# Patient Record
Sex: Male | Born: 1960 | Race: White | Hispanic: No | Marital: Married | Smoking: Current every day smoker
Health system: Southern US, Community
[De-identification: ages and names within clinical notes are randomized; demographics above are authoritative.]

## PROBLEM LIST (undated history)

## (undated) DIAGNOSIS — F419 Anxiety disorder, unspecified: Secondary | ICD-10-CM

## (undated) DIAGNOSIS — M069 Rheumatoid arthritis, unspecified: Secondary | ICD-10-CM

## (undated) DIAGNOSIS — F17200 Nicotine dependence, unspecified, uncomplicated: Secondary | ICD-10-CM

## (undated) DIAGNOSIS — E785 Hyperlipidemia, unspecified: Secondary | ICD-10-CM

## (undated) DIAGNOSIS — I10 Essential (primary) hypertension: Secondary | ICD-10-CM

## (undated) HISTORY — PX: COLONOSCOPY: SHX174

---

## 2019-04-21 ENCOUNTER — Encounter: Payer: Self-pay | Admitting: Emergency Medicine

## 2019-04-21 ENCOUNTER — Emergency Department
Admission: EM | Admit: 2019-04-21 | Discharge: 2019-04-21 | Disposition: A | Discharge status: Home / Self Care | Payer: 59 | Attending: Emergency Medicine | Admitting: Emergency Medicine

## 2019-04-21 ENCOUNTER — Emergency Department: Payer: 59

## 2019-04-21 ENCOUNTER — Other Ambulatory Visit: Payer: Self-pay

## 2019-04-21 DIAGNOSIS — F17210 Nicotine dependence, cigarettes, uncomplicated: Secondary | ICD-10-CM | POA: Diagnosis not present

## 2019-04-21 DIAGNOSIS — R079 Chest pain, unspecified: Secondary | ICD-10-CM | POA: Diagnosis not present

## 2019-04-21 LAB — BASIC METABOLIC PANEL
Anion gap: 6 (ref 5–15)
BUN: 16 mg/dL (ref 6–20)
CO2: 26 mmol/L (ref 22–32)
Calcium: 8.8 mg/dL — ABNORMAL LOW (ref 8.9–10.3)
Chloride: 108 mmol/L (ref 98–111)
Creatinine, Ser: 1.1 mg/dL (ref 0.61–1.24)
GFR calc Af Amer: >60 mL/min (ref >60)
GFR calc non Af Amer: >60 mL/min (ref >60)
Glucose, Bld: 87 mg/dL (ref 70–99)
Potassium: 4.2 mmol/L (ref 3.5–5.1)
Sodium: 140 mmol/L (ref 135–145)

## 2019-04-21 LAB — CBC
HCT: 39.3 % (ref 39.0–52.0)
Hemoglobin: 13.6 g/dL (ref 13.0–17.0)
MCH: 33 pg (ref 26.0–34.0)
MCHC: 34.6 g/dL (ref 30.0–36.0)
MCV: 95.4 fL (ref 80.0–100.0)
Platelets: 217 10*3/uL (ref 150–400)
RBC: 4.12 MIL/uL — ABNORMAL LOW (ref 4.22–5.81)
RDW: 12.8 % (ref 11.5–15.5)
WBC: 8.2 10*3/uL (ref 4.0–10.5)
nRBC: 0 % (ref 0.0–0.2)

## 2019-04-21 LAB — TROPONIN I: Troponin I: <0.03 ng/mL (ref <0.03)

## 2019-04-21 MED ORDER — SODIUM CHLORIDE 0.9% FLUSH: 3.0000 mL | Freq: Once | INTRAVENOUS | Status: DC

## 2019-04-21 MED ORDER — LORAZEPAM 1 MG PO TABS: 1.0000 mg | ORAL_TABLET | Freq: Once | ORAL | 0 refills | Status: DC | PRN

## 2019-04-21 NOTE — ED Notes (Signed)
Pt resting in bed comfortably. Denies any needs.

## 2019-04-21 NOTE — ED Notes (Signed)
Pt denies being on any BP meds.

## 2019-04-21 NOTE — ED Provider Notes (Signed)
[facility] Emergency Department Provider Note       Time seen: ----------------------------------------- 3:16 PM on 04/21/2019 -----------------------------------------   I have reviewed the triage vital signs and the nursing notes.  HISTORY   Chief Complaint Chest Pain   HPI Edwin Ali is a 58 y.o. male with no significant past medical history who presents to the ED for midsternal chest pain that began 3 days ago.  Pain is been intermittent, he has not had sweats, nausea or shortness of breath.  He describes it as a burning sensation, currently appears to have resolved.  It lasted for about 4 hours today.  History reviewed. No pertinent past medical history.  There are no active problems to display for this patient.   History reviewed. No pertinent surgical history.  Allergies Patient has no known allergies.  Social History Social History   Tobacco Use  . Smoking status: Current Every Day Smoker    Packs/day: 1.00    Types: Cigarettes  . Smokeless tobacco: Never Used  Substance Use Topics  . Alcohol use: Yes    Comment: occas.   . Drug use: Not on file   Review of Systems Constitutional: Negative for fever. Cardiovascular: Positive for chest pain Respiratory: Negative for shortness of breath. Gastrointestinal: Negative for abdominal pain, vomiting and diarrhea. Musculoskeletal: Negative for back pain. Skin: Negative for rash. Neurological: Negative for headaches, focal weakness or numbness.  All systems negative/normal/unremarkable except as stated in the HPI  ____________________________________________   PHYSICAL EXAM:  VITAL SIGNS: ED Triage Vitals  Enc Vitals Group     BP 04/21/19 1456 (!) 182/79     Pulse Rate 04/21/19 1456 62     Resp 04/21/19 1456 18     Temp 04/21/19 1456 98.4 F (36.9 C)     Temp Source 04/21/19 1456 Oral     SpO2 04/21/19 1456 97 %     Weight 04/21/19 1459 217 lb (98.4 kg)     Height 04/21/19  1459 5\' 9"  (1.753 m)     Head Circumference --      Peak Flow --      Pain Score 04/21/19 1458 2     Pain Loc --      Pain Edu? --      Excl. in GC? --     Constitutional: Alert and oriented. Well appearing and in no distress. Eyes: Conjunctivae are normal. Normal extraocular movements. ENT      Head: Normocephalic and atraumatic.      Nose: No congestion/rhinnorhea.      Mouth/Throat: Mucous membranes are moist.      Neck: No stridor. Cardiovascular: Normal rate, regular rhythm. No murmurs, rubs, or gallops. Respiratory: Normal respiratory effort without tachypnea nor retractions. Breath sounds are clear and equal bilaterally. No wheezes/rales/rhonchi. Gastrointestinal: Soft and nontender. Normal bowel sounds Musculoskeletal: Nontender with normal range of motion in extremities. No lower extremity tenderness nor edema. Neurologic:  Normal speech and language. No gross focal neurologic deficits are appreciated.  Skin:  Skin is warm, dry and intact. No rash noted. Psychiatric: Mood and affect are normal. Speech and behavior are normal.  ____________________________________________  EKG: Interpreted by me.  Sinus rhythm.  61 bpm, incomplete right bundle branch block, left axis deviation, normal QT  ____________________________________________  ED COURSE:  As part of my medical decision making, I reviewed the following data within the electronic MEDICAL RECORD NUMBER History obtained from family if available, nursing notes, old chart and ekg, as well as  notes from prior ED visits. Patient presented for chest pain, we will assess with labs and imaging as indicated at this time.   Procedures  Edwin Ali was evaluated in Emergency Department on 04/21/2019 for the symptoms described in the history of present illness. He was evaluated in the context of the global COVID-19 pandemic, which necessitated consideration that the patient might be at risk for infection with the SARS-CoV-2 virus that  causes COVID-19. Institutional protocols and algorithms that pertain to the evaluation of patients at risk for COVID-19 are in a state of rapid change based on information released by regulatory bodies including the CDC and federal and state organizations. These policies and algorithms were followed during the patient's care in the ED.  ____________________________________________   LABS (pertinent positives/negatives)  Labs Reviewed  BASIC METABOLIC PANEL - Abnormal; Notable for the following components:      Result Value   Calcium 8.8 (*)    All other components within normal limits  CBC - Abnormal; Notable for the following components:   RBC 4.12 (*)    All other components within normal limits  TROPONIN I    RADIOLOGY Images were viewed by me  Chest x-ray IMPRESSION: No edema or consolidation. ____________________________________________   DIFFERENTIAL DIAGNOSIS   Musculoskeletal pain, GERD, anxiety, unstable angina  FINAL ASSESSMENT AND PLAN  Chest pain   Plan: The patient had presented for chest pain. Patient's labs did not reveal any acute process. Patient's imaging is reassuring.  He is asymptomatic at this point.  He is cleared for outpatient follow-up.   Ulice Dash, MD    Note: This note was generated in part or whole with voice recognition software. Voice recognition is usually quite accurate but there are transcription errors that can and very often do occur. I apologize for any typographical errors that were not detected and corrected.     Emily Filbert, MD 04/21/19 [phone]

## 2019-04-21 NOTE — ED Notes (Signed)
Pt back to room.

## 2019-04-21 NOTE — ED Triage Notes (Signed)
Pt here with c/o midsternal cp that began 3 days ago, has been under a lot of stress at work and has had some stressful events at home as well. NAD. States goes to upper back as well.

## 2019-04-21 NOTE — ED Notes (Signed)
Pt leaving for imaging.

## 2020-01-25 ENCOUNTER — Ambulatory Visit
Admission: RE | Admit: 2020-01-25 | Discharge: 2020-01-25 | Disposition: A | Discharge status: Home / Self Care | Payer: 59 | Source: Ambulatory Visit | Attending: Internal Medicine | Admitting: Internal Medicine

## 2020-01-25 ENCOUNTER — Other Ambulatory Visit: Payer: Self-pay

## 2020-01-25 ENCOUNTER — Other Ambulatory Visit: Payer: Self-pay | Admitting: Internal Medicine

## 2020-01-25 DIAGNOSIS — M25531 Pain in right wrist: Secondary | ICD-10-CM

## 2020-01-25 DIAGNOSIS — M79641 Pain in right hand: Secondary | ICD-10-CM

## 2020-01-25 DIAGNOSIS — M25532 Pain in left wrist: Secondary | ICD-10-CM | POA: Diagnosis present

## 2020-01-25 DIAGNOSIS — M79642 Pain in left hand: Secondary | ICD-10-CM

## 2020-03-15 ENCOUNTER — Ambulatory Visit: Payer: 59 | Attending: Internal Medicine

## 2020-03-15 DIAGNOSIS — Z23 Encounter for immunization: Secondary | ICD-10-CM

## 2020-03-15 NOTE — Progress Notes (Signed)
   Covid-19 Vaccination Clinic  Name:  Edwin Ali    MRN: [id number] DOB: Jan 17, 1961  03/15/2020  Mr. Edwin Ali was observed post Covid-19 immunization for 15 minutes without incident. He was provided with Vaccine Information Sheet and instruction to access the V-Safe system.   Mr. Edwin Ali was instructed to call 911 with any severe reactions post vaccine: Marland Kitchen Difficulty breathing  . Swelling of face and throat  . A fast heartbeat  . A bad rash all over body  . Dizziness and weakness   Immunizations Administered    Name Date Dose VIS Date Route   Pfizer COVID-19 Vaccine 03/15/2020  9:25 AM 0.3 mL 11/18/2019 Intramuscular   Manufacturer: [employer], [employer]   Lot: [id number]   NDC: [address]-4

## 2020-04-10 ENCOUNTER — Ambulatory Visit: Payer: 59 | Attending: Internal Medicine

## 2020-04-10 DIAGNOSIS — Z23 Encounter for immunization: Secondary | ICD-10-CM

## 2020-04-10 NOTE — Progress Notes (Signed)
   Covid-19 Vaccination Clinic  Name:  Edwin Ali    MRN: [id number] DOB: 03-06-1961  04/10/2020  Edwin Ali was observed post Covid-19 immunization for 15 minutes without incident. He was provided with Vaccine Information Sheet and instruction to access the V-Safe system.   Edwin Ali was instructed to call 911 with any severe reactions post vaccine: Marland Kitchen Difficulty breathing  . Swelling of face and throat  . A fast heartbeat  . A bad rash all over body  . Dizziness and weakness   Immunizations Administered    Name Date Dose VIS Date Route   Pfizer COVID-19 Vaccine 04/10/2020  4:58 PM 0.3 mL 02/01/2019 Intramuscular   Manufacturer: [employer], [employer]   Lot: [id number]   NDC: [address]-6

## 2020-05-12 IMAGING — CR DG HAND COMPLETE 3+V*L*
1 series · 3 of 3 positions shown · non-contrast
Comparison: None.

CLINICAL DATA: Chronic pain in the bilateral hands and wrists for
several years, no known injury, worst pain the bilateral thumbs

EXAM:
RIGHT HAND - COMPLETE 3+ VIEW; LEFT WRIST - COMPLETE 3+ VIEW; RIGHT
WRIST - COMPLETE 3+ VIEW; LEFT HAND - COMPLETE 3+ VIEW

[Series 1: dg hand complete left · 0.14mm/px · 3 of 3 slices shown]
[im 1/3]
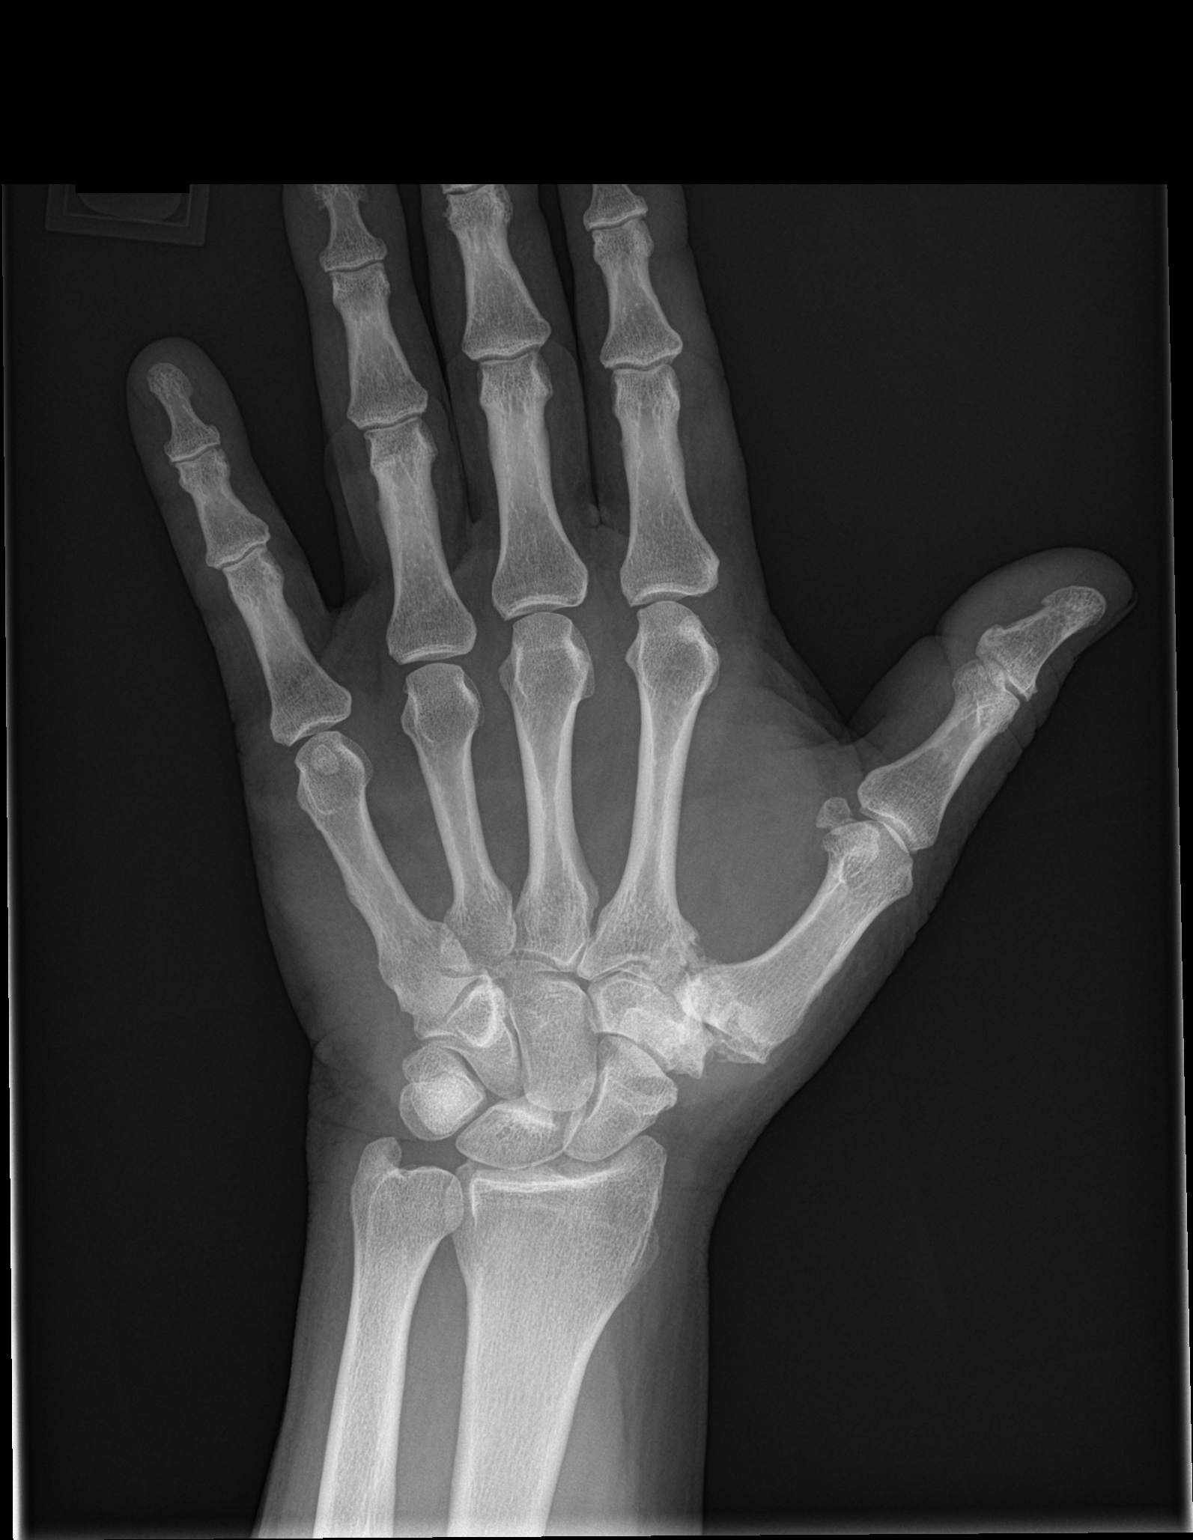
[im 2/3]
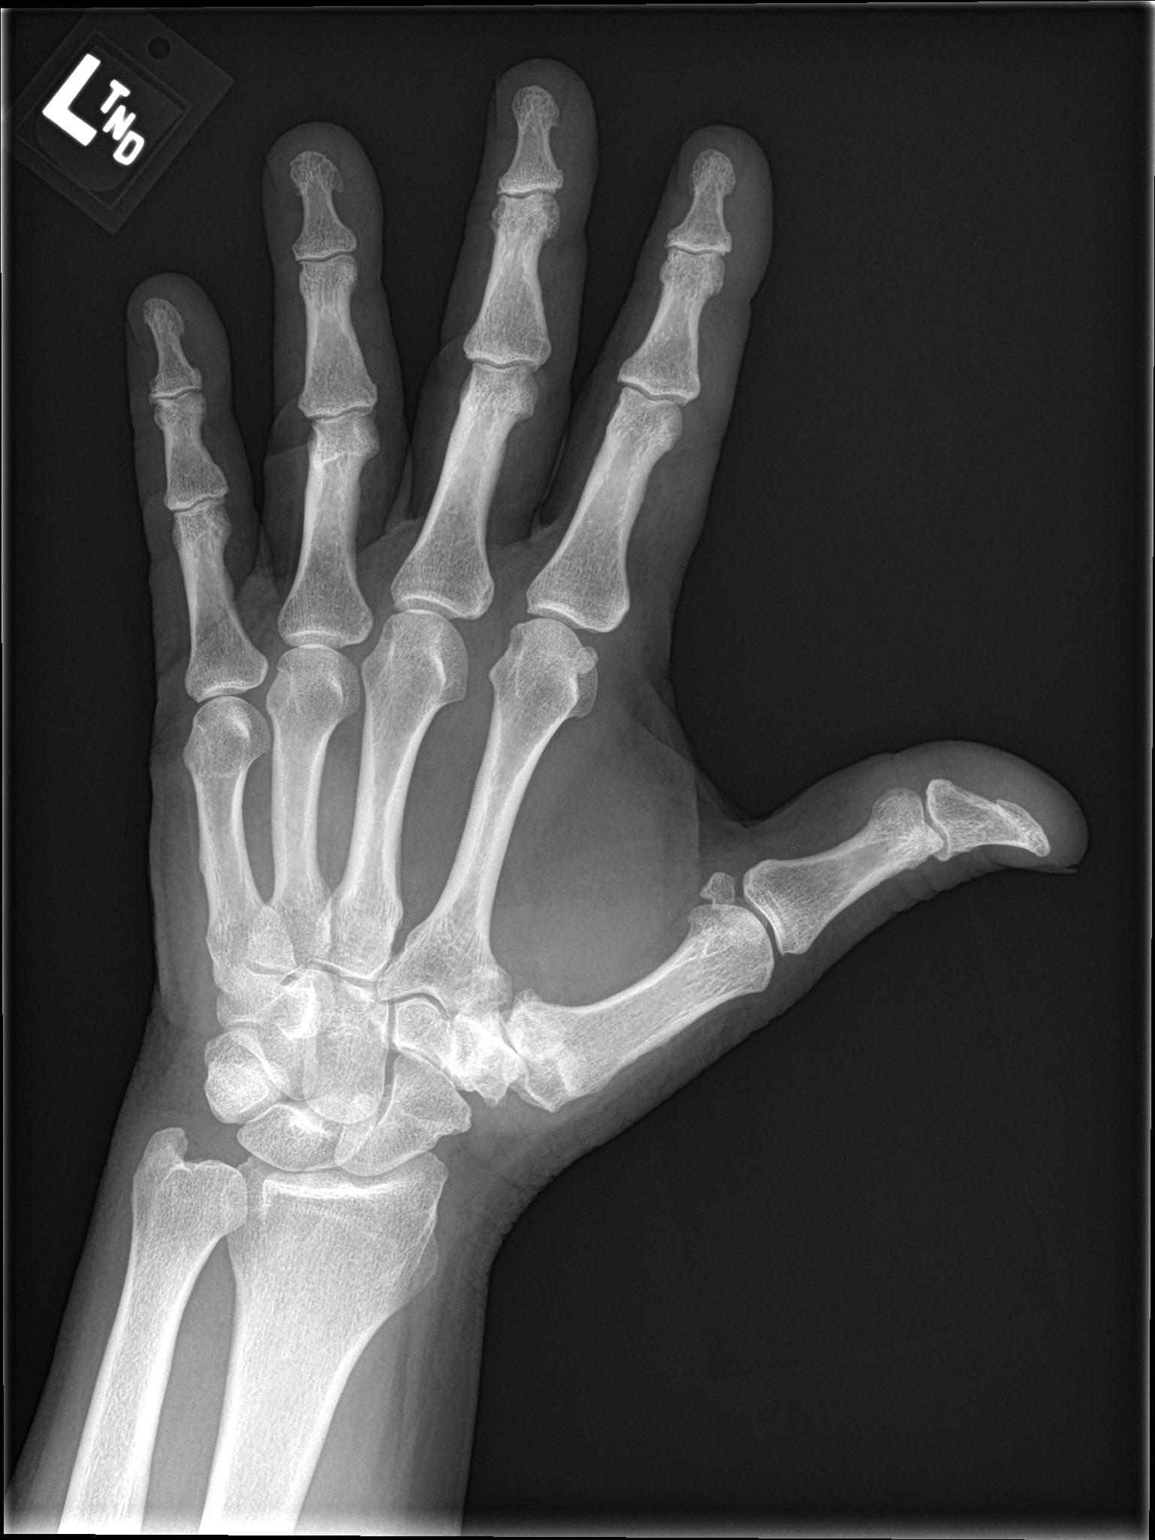
[im 3/3]
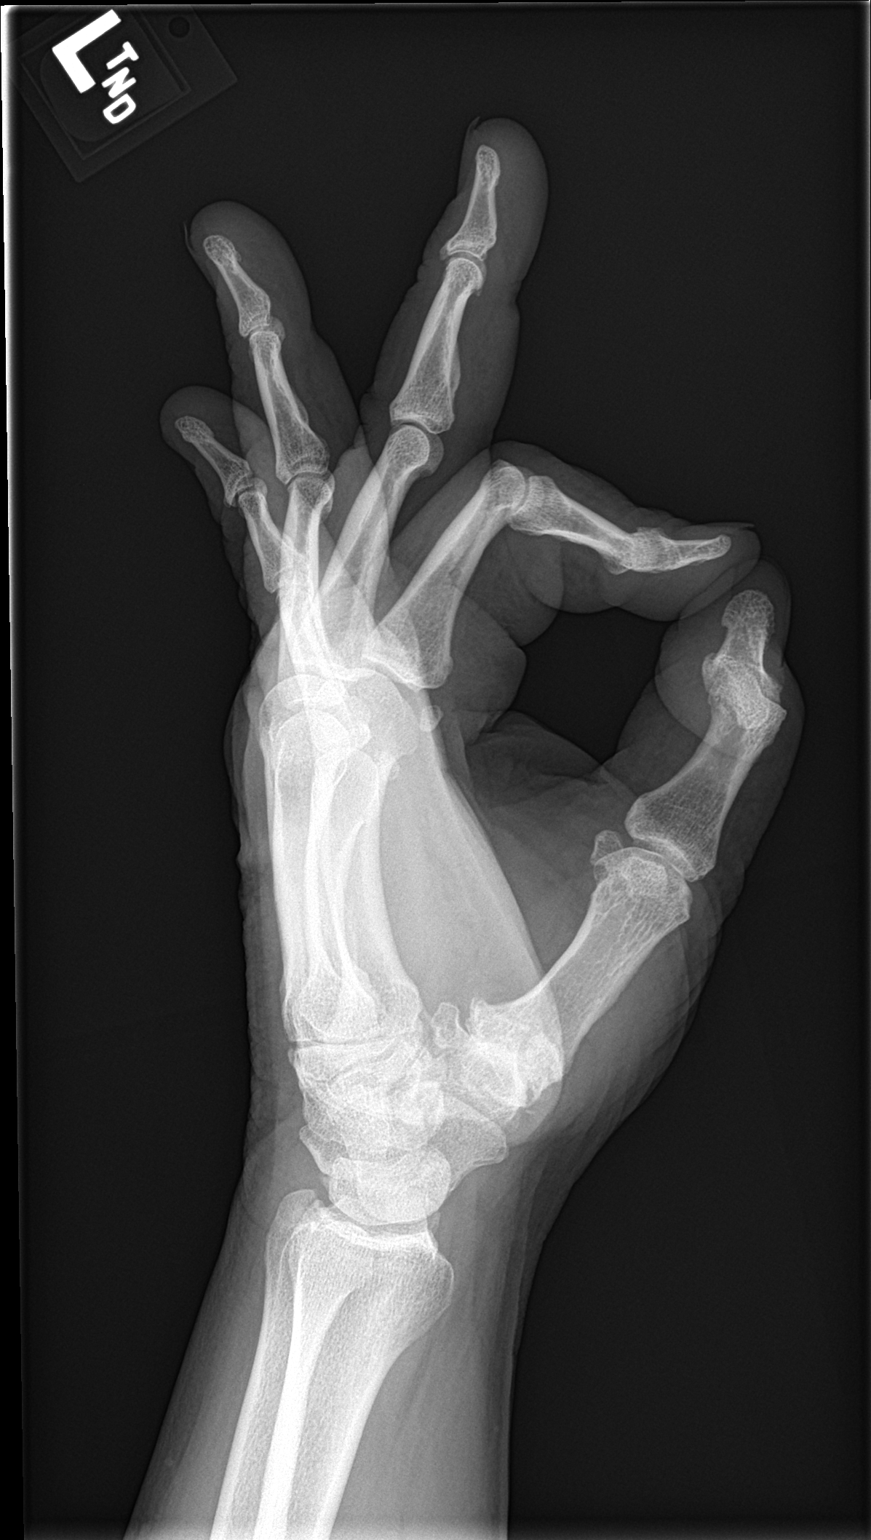

[3 of 3 positions shown; findings below may reference images not displayed]

FINDINGS: No fracture or dislocation of the bilateral hands or wrists. The
carpi are normally aligned. There is severe left first
carpometacarpal arthrosis and moderate right first carpometacarpal
arthrosis. Mild bilateral triscaphe joint arthrosis. There is
otherwise minimal, symmetric bilateral osteoarthritic pattern
arthrosis throughout. No bony erosion or sclerosis. The carpi are
normally aligned. Soft tissues are unremarkable.
IMPRESSION: 1.  No fracture or dislocation of the bilateral hands or wrists.

2. There is severe left first carpometacarpal arthrosis and moderate
right first carpometacarpal arthrosis.

3. Otherwise minimal, symmetric bilateral osteoarthritic pattern
arthrosis throughout.

## 2020-05-12 IMAGING — CR DG WRIST COMPLETE 3+V*L*
1 series · 4 of 4 positions shown · non-contrast
Comparison: None.

CLINICAL DATA: Chronic pain in the bilateral hands and wrists for
several years, no known injury, worst pain the bilateral thumbs

EXAM:
RIGHT HAND - COMPLETE 3+ VIEW; LEFT WRIST - COMPLETE 3+ VIEW; RIGHT
WRIST - COMPLETE 3+ VIEW; LEFT HAND - COMPLETE 3+ VIEW

[Series 1: dg wrist complete left · 0.14mm/px · 4 of 4 slices shown]
[im 1/4]
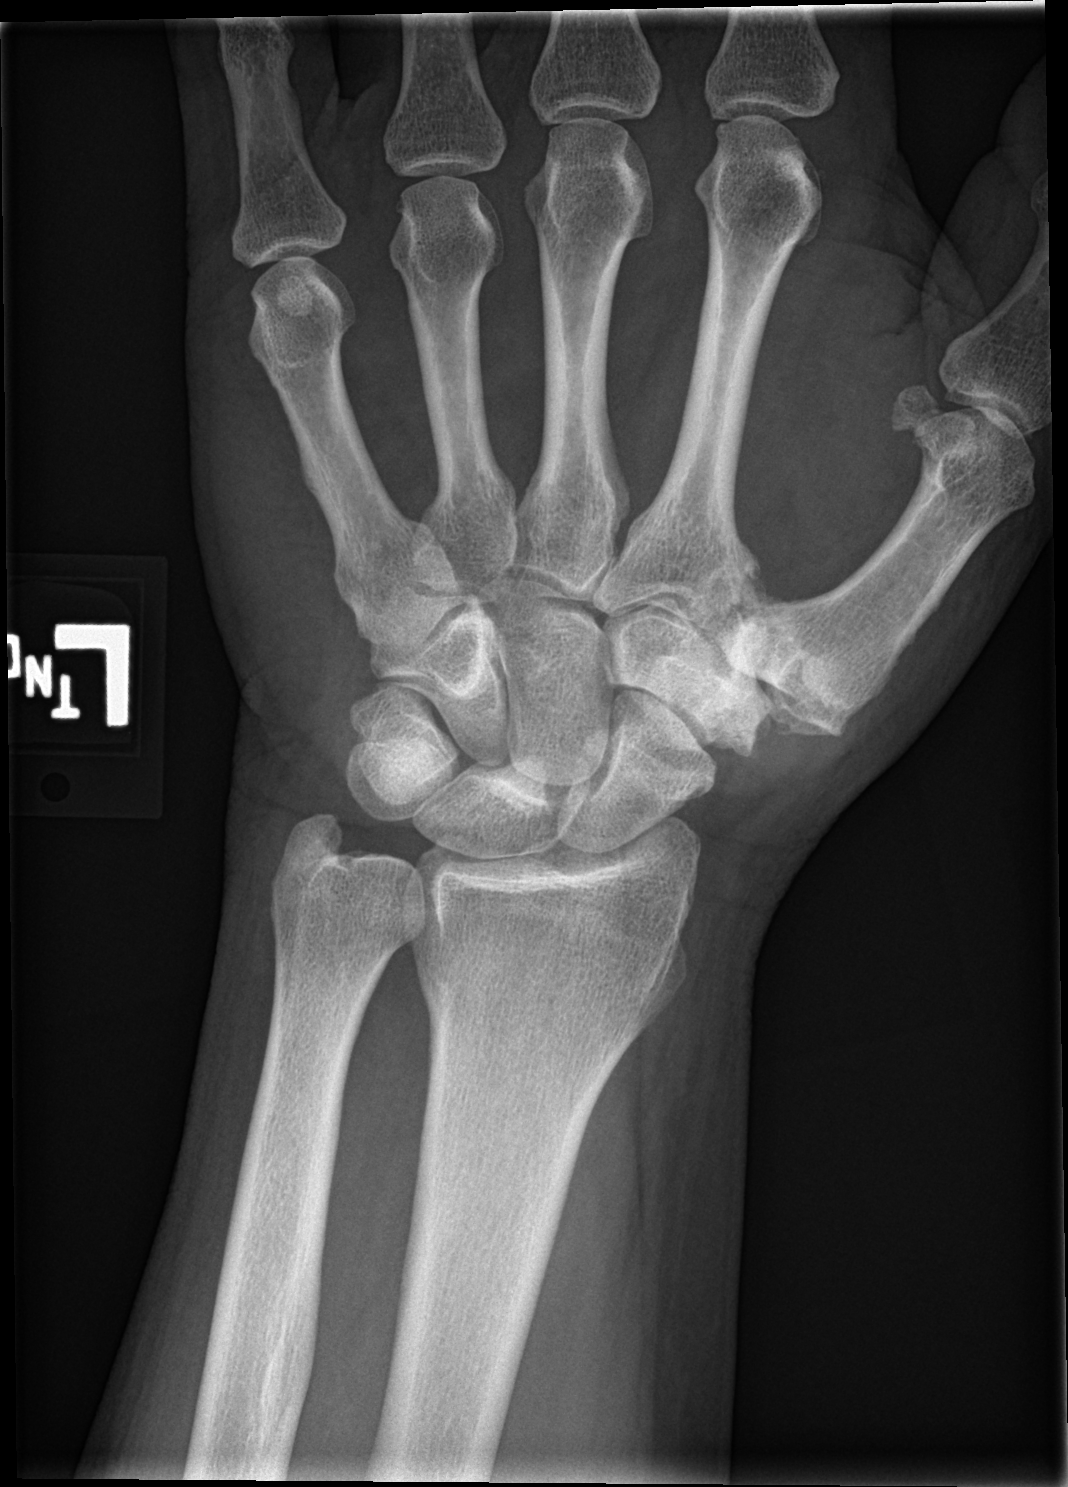
[im 2/4]
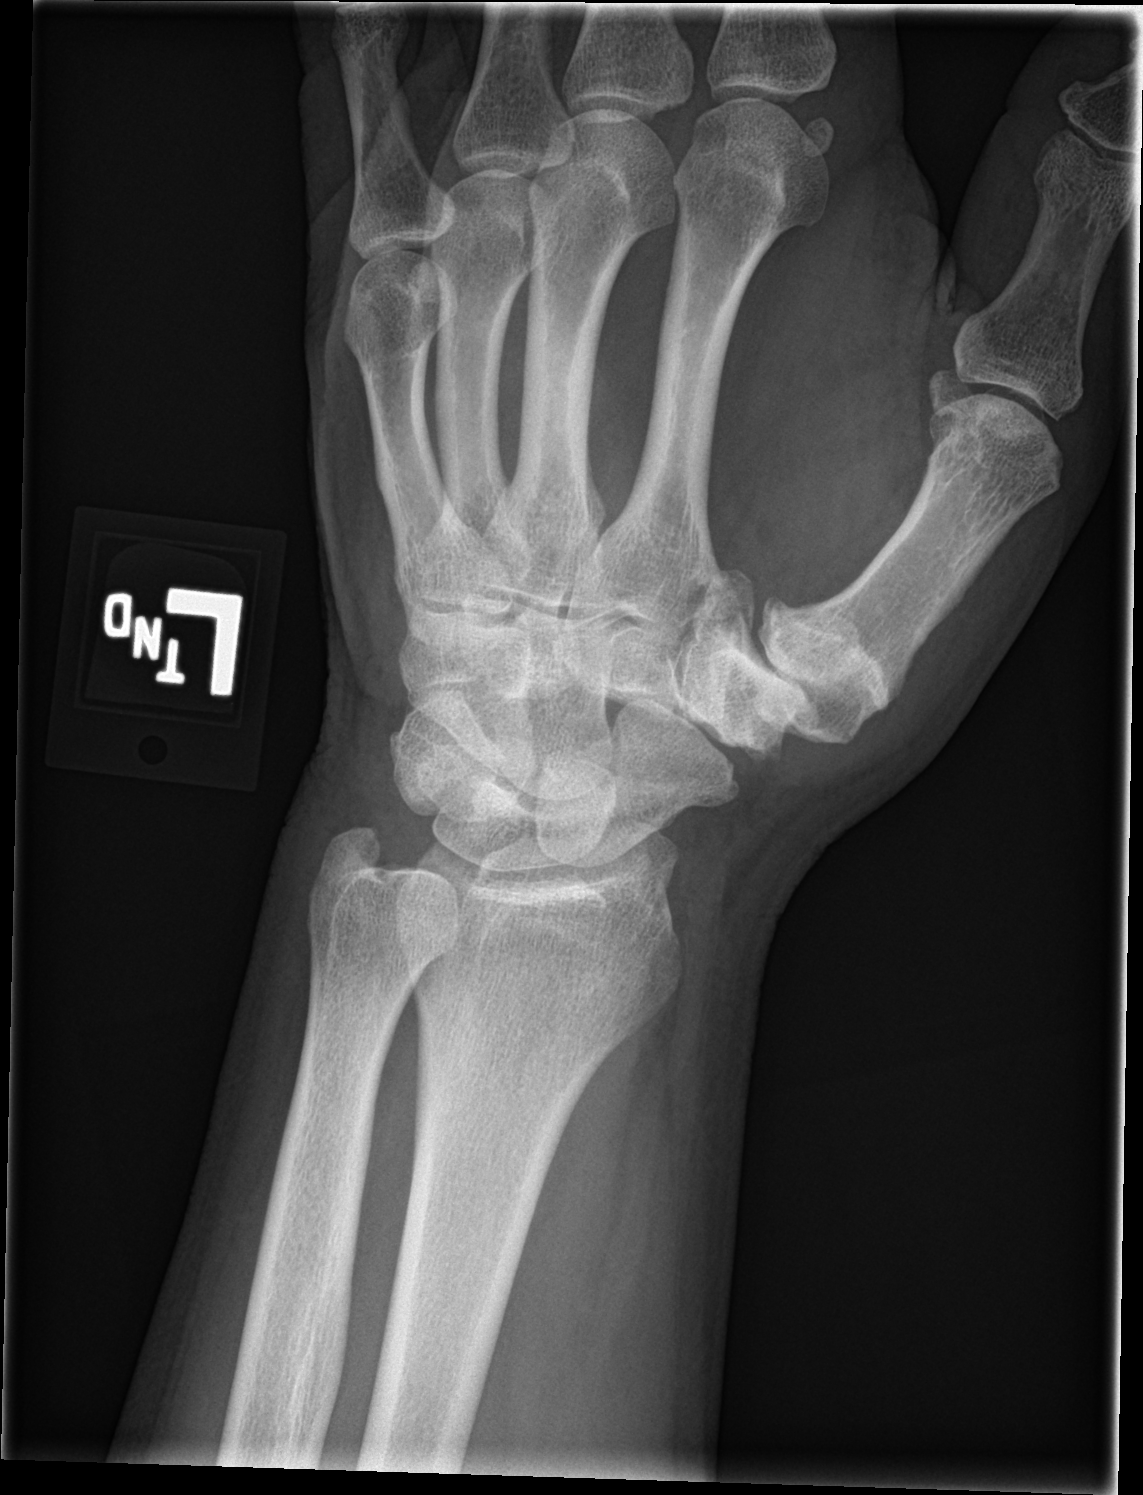
[im 3/4]
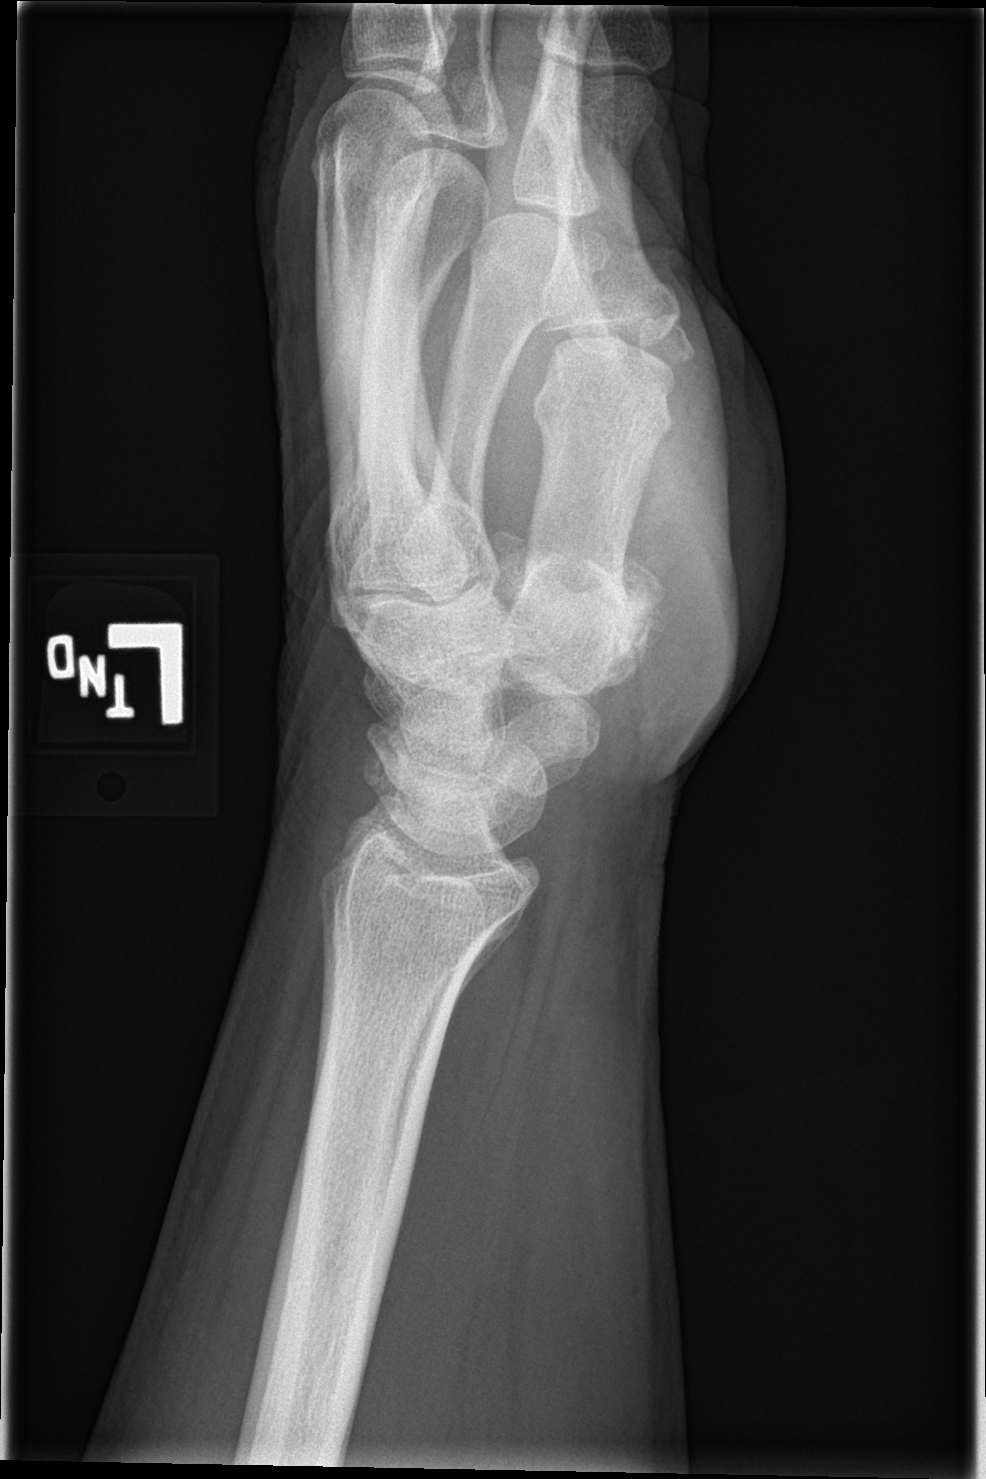
[im 4/4]
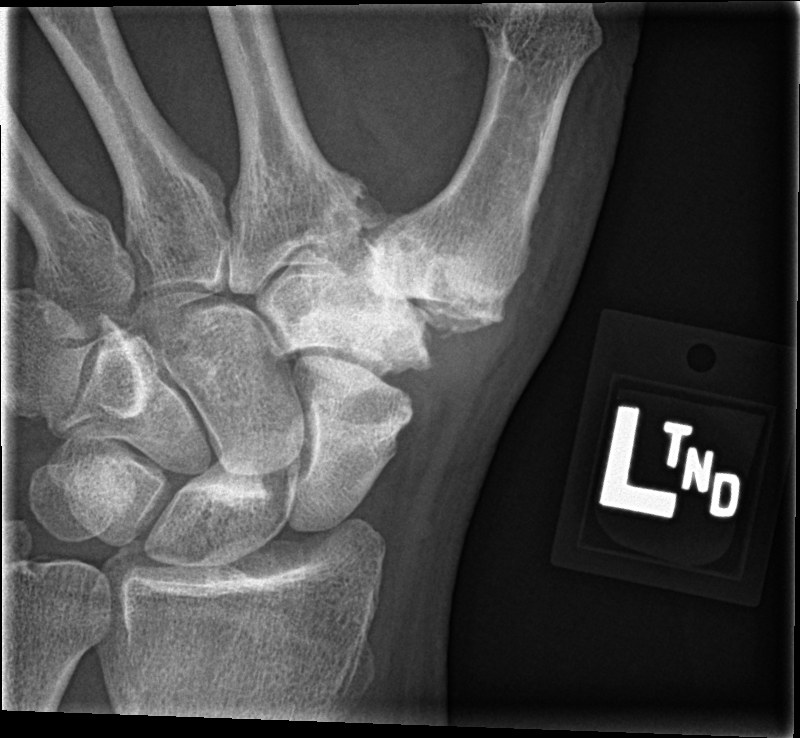

[4 of 4 positions shown; findings below may reference images not displayed]

FINDINGS: No fracture or dislocation of the bilateral hands or wrists. The
carpi are normally aligned. There is severe left first
carpometacarpal arthrosis and moderate right first carpometacarpal
arthrosis. Mild bilateral triscaphe joint arthrosis. There is
otherwise minimal, symmetric bilateral osteoarthritic pattern
arthrosis throughout. No bony erosion or sclerosis. The carpi are
normally aligned. Soft tissues are unremarkable.
IMPRESSION: 1.  No fracture or dislocation of the bilateral hands or wrists.

2. There is severe left first carpometacarpal arthrosis and moderate
right first carpometacarpal arthrosis.

3. Otherwise minimal, symmetric bilateral osteoarthritic pattern
arthrosis throughout.

## 2020-12-10 ENCOUNTER — Other Ambulatory Visit: Payer: 59

## 2020-12-10 DIAGNOSIS — Z20822 Contact with and (suspected) exposure to covid-19: Secondary | ICD-10-CM

## 2020-12-11 LAB — SARS-COV-2, NAA 2 DAY TAT

## 2020-12-11 LAB — NOVEL CORONAVIRUS, NAA: SARS-CoV-2, NAA: NOT DETECTED

## 2022-02-26 DIAGNOSIS — I10 Essential (primary) hypertension: Secondary | ICD-10-CM | POA: Diagnosis not present

## 2022-02-26 DIAGNOSIS — Z0000 Encounter for general adult medical examination without abnormal findings: Secondary | ICD-10-CM | POA: Diagnosis not present

## 2022-02-26 DIAGNOSIS — E782 Mixed hyperlipidemia: Secondary | ICD-10-CM | POA: Diagnosis not present

## 2022-02-26 DIAGNOSIS — R69 Illness, unspecified: Secondary | ICD-10-CM | POA: Diagnosis not present

## 2022-07-01 DIAGNOSIS — R69 Illness, unspecified: Secondary | ICD-10-CM | POA: Diagnosis not present

## 2022-07-01 DIAGNOSIS — I10 Essential (primary) hypertension: Secondary | ICD-10-CM | POA: Diagnosis not present

## 2022-07-01 DIAGNOSIS — E559 Vitamin D deficiency, unspecified: Secondary | ICD-10-CM | POA: Diagnosis not present

## 2022-07-01 DIAGNOSIS — E782 Mixed hyperlipidemia: Secondary | ICD-10-CM | POA: Diagnosis not present

## 2022-07-01 DIAGNOSIS — Z125 Encounter for screening for malignant neoplasm of prostate: Secondary | ICD-10-CM | POA: Diagnosis not present

## 2023-01-26 DIAGNOSIS — Z125 Encounter for screening for malignant neoplasm of prostate: Secondary | ICD-10-CM | POA: Diagnosis not present

## 2023-01-26 DIAGNOSIS — E782 Mixed hyperlipidemia: Secondary | ICD-10-CM | POA: Diagnosis not present

## 2023-01-26 DIAGNOSIS — E559 Vitamin D deficiency, unspecified: Secondary | ICD-10-CM | POA: Diagnosis not present

## 2023-01-27 DIAGNOSIS — Z1231 Encounter for screening mammogram for malignant neoplasm of breast: Secondary | ICD-10-CM | POA: Diagnosis not present

## 2023-02-02 DIAGNOSIS — F17210 Nicotine dependence, cigarettes, uncomplicated: Secondary | ICD-10-CM | POA: Diagnosis not present

## 2023-02-02 DIAGNOSIS — Z0000 Encounter for general adult medical examination without abnormal findings: Secondary | ICD-10-CM | POA: Diagnosis not present

## 2023-02-02 DIAGNOSIS — Z23 Encounter for immunization: Secondary | ICD-10-CM | POA: Diagnosis not present

## 2023-02-02 DIAGNOSIS — Z1211 Encounter for screening for malignant neoplasm of colon: Secondary | ICD-10-CM | POA: Diagnosis not present

## 2023-02-02 DIAGNOSIS — I10 Essential (primary) hypertension: Secondary | ICD-10-CM | POA: Diagnosis not present

## 2023-02-02 DIAGNOSIS — E782 Mixed hyperlipidemia: Secondary | ICD-10-CM | POA: Diagnosis not present

## 2023-02-02 DIAGNOSIS — E559 Vitamin D deficiency, unspecified: Secondary | ICD-10-CM | POA: Diagnosis not present

## 2023-05-08 ENCOUNTER — Other Ambulatory Visit: Payer: Self-pay

## 2023-05-08 DIAGNOSIS — F17210 Nicotine dependence, cigarettes, uncomplicated: Secondary | ICD-10-CM

## 2023-05-08 DIAGNOSIS — Z87891 Personal history of nicotine dependence: Secondary | ICD-10-CM

## 2023-06-10 ENCOUNTER — Ambulatory Visit: Payer: PRIVATE HEALTH INSURANCE | Admitting: Acute Care

## 2023-06-10 ENCOUNTER — Encounter: Payer: Self-pay | Admitting: Acute Care

## 2023-06-10 ENCOUNTER — Ambulatory Visit
Admission: RE | Admit: 2023-06-10 | Discharge: 2023-06-10 | Disposition: A | Discharge status: Home / Self Care | Payer: PRIVATE HEALTH INSURANCE | Source: Ambulatory Visit | Attending: Internal Medicine | Admitting: Internal Medicine

## 2023-06-10 DIAGNOSIS — Z87891 Personal history of nicotine dependence: Secondary | ICD-10-CM | POA: Insufficient documentation

## 2023-06-10 DIAGNOSIS — Z122 Encounter for screening for malignant neoplasm of respiratory organs: Secondary | ICD-10-CM | POA: Insufficient documentation

## 2023-06-10 DIAGNOSIS — J439 Emphysema, unspecified: Secondary | ICD-10-CM | POA: Diagnosis not present

## 2023-06-10 DIAGNOSIS — F17210 Nicotine dependence, cigarettes, uncomplicated: Secondary | ICD-10-CM

## 2023-06-10 NOTE — Patient Instructions (Signed)
Thank you for participating in the [facility]. It was our pleasure to meet you today. We will call you with the results of your scan within the next few days. Your scan will be assigned a Lung RADS category score by the physicians reading the scans.  This Lung RADS score determines follow up scanning.  See below for description of categories, and follow up screening recommendations. We will be in touch to schedule your follow up screening annually or based on recommendations of our providers. We will fax a copy of your scan results to your Primary Care Physician, or the physician who referred you to the program, to ensure they have the results. Please call the office if you have any questions or concerns regarding your scanning experience or results.  Our office number is [phone]. Please speak with Denise Phelps, RN. , or  Denise Buckner RN, They are  our Lung Cancer Screening RN.'s If They are unavailable when you call, Please leave a message on the voice mail. We will return your call at our earliest convenience.This voice mail is monitored several times a day.  Remember, if your scan is normal, we will scan you annually as long as you continue to meet the criteria for the program. (Age 50-80, Current smoker or smoker who has quit within the last 15 years). If you are a smoker, remember, quitting is the single most powerful action that you can take to decrease your risk of lung cancer and other pulmonary, breathing related problems. We know quitting is hard, and we are here to help.  Please let us know if there is anything we can do to help you meet your goal of quitting. If you are a former smoker, congratulations. We are proud of you! Remain smoke free! Remember you can refer friends or family members through the number above.  We will screen them to make sure they meet criteria for the program. Thank you for helping us take better care of you by  participating in Lung Screening.  You can receive free nicotine replacement therapy ( patches, gum or mints) by calling 1-800-QUIT NOW. Please call so we can get you on the path to becoming  a non-smoker. I know it is hard, but you can do this!  Lung RADS Categories:  Lung RADS 1: no nodules or definitely non-concerning nodules.  Recommendation is for a repeat annual scan in 12 months.  Lung RADS 2:  nodules that are non-concerning in appearance and behavior with a very low likelihood of becoming an active cancer. Recommendation is for a repeat annual scan in 12 months.  Lung RADS 3: nodules that are probably non-concerning , includes nodules with a low likelihood of becoming an active cancer.  Recommendation is for a 6-month repeat screening scan. Often noted after an upper respiratory illness. We will be in touch to make sure you have no questions, and to schedule your 6-month scan.  Lung RADS 4 A: nodules with concerning findings, recommendation is most often for a follow up scan in 3 months or additional testing based on our provider's assessment of the scan. We will be in touch to make sure you have no questions and to schedule the recommended 3 month follow up scan.  Lung RADS 4 B:  indicates findings that are concerning. We will be in touch with you to schedule additional diagnostic testing based on our provider's  assessment of the scan.  Other options for assistance in smoking cessation (   As covered by your insurance benefits)  Hypnosis for smoking cessation  [employer] [phone]  Acupuncture for smoking cessation  [facility] [phone]   

## 2023-06-10 NOTE — Progress Notes (Signed)
Virtual Visit via Telephone Note  I connected with Edwin Ali on 06/10/23 at  8:30 AM EDT by telephone and verified that I am speaking with the correct person using two identifiers.  Location: Patient:  At home Provider: [address] [place], [place], [address]    I discussed the limitations, risks, security and privacy concerns of performing an evaluation and management service by telephone and the availability of in person appointments. I also discussed with the patient that there may be a patient responsible charge related to this service. The patient expressed understanding and agreed to proceed.   Shared Decision Making Visit Lung Cancer Screening Program [phone])   Eligibility: Age 62 y.o. Pack Years Smoking History Calculation 48 pack year smoking history (# packs/per year x # years smoked) Recent History of coughing up blood  no Unexplained weight loss? no ( >Than 15 pounds within the last 6 months ) Prior History Lung / other cancer no (Diagnosis within the last 5 years already requiring surveillance chest CT Scans). Smoking Status Current Smoker Former Smokers: Years since quit:  NA  Quit Date:  NA  Visit Components: Discussion included one or more decision making aids. yes Discussion included risk/benefits of screening. yes Discussion included potential follow up diagnostic testing for abnormal scans. yes Discussion included meaning and risk of over diagnosis. yes Discussion included meaning and risk of False Positives. yes Discussion included meaning of total radiation exposure. yes  Counseling Included: Importance of adherence to annual lung cancer LDCT screening. yes Impact of comorbidities on ability to participate in the program. yes Ability and willingness to under diagnostic treatment. yes  Smoking Cessation Counseling: Current Smokers:  Discussed importance of smoking cessation. yes Information about tobacco cessation classes and  interventions provided to patient. yes Patient provided with "ticket" for LDCT Scan. yes Symptomatic Patient. no  Counseling NA Diagnosis Code: Tobacco Use Z72.0 Asymptomatic Patient yes  Counseling (Intermediate counseling: > three minutes counseling) U0454 Former Smokers:  Discussed the importance of maintaining cigarette abstinence. yes Diagnosis Code: Personal History of Nicotine Dependence. U98.119 Information about tobacco cessation classes and interventions provided to patient. Yes Patient provided with "ticket" for LDCT Scan. yes Written Order for Lung Cancer Screening with LDCT placed in Epic. Yes (CT Chest Lung Cancer Screening Low Dose W/O CM) [id number] Z12.2-Screening of respiratory organs Z87.891-Personal history of nicotine dependence  I have spent 25 minutes of face to face/ virtual visit   time with  Edwin Ali discussing the risks and benefits of lung cancer screening. We viewed / discussed a power point together that explained in detail the above noted topics. We paused at intervals to allow for questions to be asked and answered to ensure understanding.We discussed that the single most powerful action that he can take to decrease his risk of developing lung cancer is to quit smoking. We discussed whether or not he is ready to commit to setting a quit date. We discussed options for tools to aid in quitting smoking including nicotine replacement therapy, non-nicotine medications, support groups, Quit Smart classes, and behavior modification. We discussed that often times setting smaller, more achievable goals, such as eliminating 1 cigarette a day for a week and then 2 cigarettes a day for a week can be helpful in slowly decreasing the number of cigarettes smoked. This allows for a sense of accomplishment as well as providing a clinical benefit. I provided  him  with smoking cessation  information  with contact information for community resources, classes,  free nicotine replacement  therapy, and access to mobile apps, text messaging, and on-line smoking cessation help. I have also provided  him  the office contact information in the event he needs to contact me, or the screening staff. We discussed the time and location of the scan, and that either Abigail Miyamoto RN, Karlton Lemon, RN  or I will call / send a letter with the results within 24-72 hours of receiving them. The patient verbalized understanding of all of  the above and had no further questions upon leaving the office. They have my contact information in the event they have any further questions.  I spent 3-4 minutes counseling on smoking cessation and the health risks of continued tobacco abuse.  I explained to the patient that there has been a high incidence of coronary artery disease noted on these exams. I explained that this is a non-gated exam therefore degree or severity cannot be determined. This patient is on statin therapy. I have asked the patient to follow-up with their PCP regarding any incidental finding of coronary artery disease and management with diet or medication as their PCP  feels is clinically indicated. The patient verbalized understanding of the above and had no further questions upon completion of the visit.      Bevelyn Ngo, NP 06/10/2023

## 2023-06-15 ENCOUNTER — Other Ambulatory Visit: Payer: Self-pay

## 2023-06-15 DIAGNOSIS — Z87891 Personal history of nicotine dependence: Secondary | ICD-10-CM

## 2023-06-15 DIAGNOSIS — F17210 Nicotine dependence, cigarettes, uncomplicated: Secondary | ICD-10-CM

## 2023-07-08 ENCOUNTER — Ambulatory Visit: Payer: PRIVATE HEALTH INSURANCE

## 2023-07-08 DIAGNOSIS — K642 Third degree hemorrhoids: Secondary | ICD-10-CM

## 2023-07-08 DIAGNOSIS — K621 Rectal polyp: Secondary | ICD-10-CM

## 2023-07-08 DIAGNOSIS — Z1211 Encounter for screening for malignant neoplasm of colon: Secondary | ICD-10-CM

## 2024-06-15 ENCOUNTER — Other Ambulatory Visit: Payer: Self-pay | Admitting: Family Medicine

## 2024-06-15 DIAGNOSIS — M5416 Radiculopathy, lumbar region: Secondary | ICD-10-CM

## 2024-06-15 DIAGNOSIS — M25552 Pain in left hip: Secondary | ICD-10-CM

## 2024-06-16 ENCOUNTER — Ambulatory Visit
Admission: RE | Admit: 2024-06-16 | Discharge: 2024-06-16 | Disposition: A | Discharge status: Home / Self Care | Payer: PRIVATE HEALTH INSURANCE | Source: Ambulatory Visit | Attending: Family Medicine | Admitting: Family Medicine

## 2024-06-16 DIAGNOSIS — M25552 Pain in left hip: Secondary | ICD-10-CM

## 2024-06-16 DIAGNOSIS — M5416 Radiculopathy, lumbar region: Secondary | ICD-10-CM

## 2024-07-07 ENCOUNTER — Other Ambulatory Visit: Payer: Self-pay | Admitting: Internal Medicine

## 2024-07-07 DIAGNOSIS — M542 Cervicalgia: Secondary | ICD-10-CM

## 2024-07-13 ENCOUNTER — Ambulatory Visit
Admission: RE | Admit: 2024-07-13 | Discharge: 2024-07-13 | Disposition: A | Discharge status: Home / Self Care | Payer: PRIVATE HEALTH INSURANCE | Source: Ambulatory Visit | Attending: Internal Medicine | Admitting: Internal Medicine

## 2024-07-13 DIAGNOSIS — M542 Cervicalgia: Secondary | ICD-10-CM | POA: Diagnosis present

## 2024-07-20 ENCOUNTER — Other Ambulatory Visit: Payer: Self-pay | Admitting: Family Medicine

## 2024-07-20 DIAGNOSIS — M5414 Radiculopathy, thoracic region: Secondary | ICD-10-CM

## 2024-07-23 ENCOUNTER — Ambulatory Visit
Admission: RE | Admit: 2024-07-23 | Discharge: 2024-07-23 | Disposition: A | Discharge status: Home / Self Care | Payer: PRIVATE HEALTH INSURANCE | Source: Ambulatory Visit | Attending: Family Medicine | Admitting: Family Medicine

## 2024-07-23 DIAGNOSIS — M5414 Radiculopathy, thoracic region: Secondary | ICD-10-CM

## 2024-07-29 NOTE — Progress Notes (Signed)
   08/05/24 9:17 AM   Edwin Ali February 08, 1961 [id number]  CC: Penile curvature   HPI: 63 year old male here for initial evaluation of penile curvature.  Referred in August 2025.  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  Onset: May 2025   Stable ~1-2 months Deformity: dorsal curvature - 20-30 degrees, POMC at mid shaft, no plaques Erectile dysfunction: Yes   SHIM: 14 (began in May 2025 as well, mild bother, mainly issues with maintenance) Sexual bother / QOL: mild to moderate bother Married, wife is not bothered by curve  Denies any history of prior penile trauma, Dupuytren's contracture, connective tissue disorder  Prior treatments / therapies: None   History of rheumatoid arthritis, polyarthralgia, on a prior prednisone taper and methotrexate.  Also history of MSK orthopedic pain of lower back, left leg and left medial thigh.  PMH: History reviewed. No pertinent past medical history.  Surgical History: History reviewed. No pertinent surgical history.  Family History: History reviewed. No pertinent family history.  Social History:  reports that he has been smoking cigarettes. He has a 48 pack-year smoking history. He has never used smokeless tobacco. He reports current alcohol use. No history on file for drug use.  Physical Exam: BP 125/74 (BP Location: Left Arm, Patient Position: Sitting, Cuff Size: Large)   Pulse 78   Ht 5' 9 (1.753 m)   Wt 209 lb 3.2 oz (94.9 kg)   BMI 30.89 kg/m    Constitutional:  Alert and oriented, No acute distress. Cardiovascular: No clubbing, cyanosis, or edema. Respiratory: Normal respiratory effort, no increased work of breathing. GI: Abdomen is soft, nontender, nondistended, no abdominal masses GU: normal penoscrotal exam, circumcised phallus. No palpable plaque or visible lesions Skin: No rashes, bruises or suspicious lesions. Neurologic: Grossly intact, no focal deficits, moving all 4  extremities. Psychiatric: Normal mood and affect.  Laboratory Data: N/A   Pertinent Imaging: N/A   Assessment & Plan:    Peyronie disease Assessment & Plan: Peyronie's disease was discussed, including its variable natural history--some men stabilize or improve modestly with conservative measures, though many remain unchanged. Management is highly driven by patient preference, degree of deformity, degree of bother and patient's goals. We reviewed medical and traction-based therapies, which may reduce curvature or pain but have limited efficacy once stable. Office-based Xiaflex injections are suitable in select cases. Surgical options were outlined broadly (plication, grafting, or prosthesis depending on erectile function and deformity severity).   - continue to monitor for now, at least 3-4 additional months of stability - His main issue is ED, which we will address today - if he intends to return for visit, bothersome curvature -- recommend brining erect photos in hand to assess [facility] /anatomy    ED (erectile dysfunction) of organic origin Assessment & Plan: May be related to new diagnosis PD vs organic/vasculogenic   We reviewed the basic tenets of erectile dysfunction management, including normal erectile physiology, common contributing factors, and the spectrum of therapeutic options. Conservative measures such as lifestyle modification, optimization of comorbid conditions, and avoidance of exacerbating medications were discussed. Pharmacologic options including PDE5 inhibitors, intracavernosal or intraurethral therapies, and vacuum erection devices were reviewed, as well as surgical approaches such as penile prosthesis implantation. All questions were answered.   - start with 50mg  sildenafil  PRN - r/b/SEs reviewed today       Penne Skye, MD 08/05/2024  [facility] [address] [place], [place] [address] [phone]

## 2024-08-05 ENCOUNTER — Ambulatory Visit: Payer: PRIVATE HEALTH INSURANCE | Admitting: Urology

## 2024-08-05 ENCOUNTER — Encounter: Payer: Self-pay | Admitting: Urology

## 2024-08-05 VITALS — BP 125/74 | HR 78 | Ht 69.0 in | Wt 209.2 lb

## 2024-08-05 DIAGNOSIS — N529 Male erectile dysfunction, unspecified: Secondary | ICD-10-CM

## 2024-08-05 DIAGNOSIS — N486 Induration penis plastica: Secondary | ICD-10-CM

## 2024-08-05 MED ORDER — SILDENAFIL CITRATE 50 MG PO TABS: 50.0000 mg | ORAL_TABLET | Freq: Every day | ORAL | 6 refills | Status: AC | PRN

## 2024-08-05 NOTE — Assessment & Plan Note (Signed)
 May be related to new diagnosis PD vs organic/vasculogenic   We reviewed the basic tenets of erectile dysfunction management, including normal erectile physiology, common contributing factors, and the spectrum of therapeutic options. Conservative measures such as lifestyle modification, optimization of comorbid conditions, and avoidance of exacerbating medications were discussed. Pharmacologic options including PDE5 inhibitors, intracavernosal or intraurethral therapies, and vacuum erection devices were reviewed, as well as surgical approaches such as penile prosthesis implantation. All questions were answered.   - start with 50mg  sildenafil  PRN - r/b/SEs reviewed today

## 2024-08-05 NOTE — Addendum Note (Signed)
 Addended byBETHA CORIE PLATER on: 08/05/2024 09:19 AM   Modules accepted: Orders

## 2024-08-05 NOTE — Assessment & Plan Note (Signed)
 Peyronie's disease was discussed, including its variable natural history--some men stabilize or improve modestly with conservative measures, though many remain unchanged. Management is highly driven by patient preference, degree of deformity, degree of bother and patient's goals. We reviewed medical and traction-based therapies, which may reduce curvature or pain but have limited efficacy once stable. Office-based Xiaflex injections are suitable in select cases. Surgical options were outlined broadly (plication, grafting, or prosthesis depending on erectile function and deformity severity).   - continue to monitor for now, at least 3-4 additional months of stability - His main issue is ED, which we will address today - if he intends to return for visit, bothersome curvature -- recommend brining erect photos in hand to assess [facility] carlon

## 2024-09-09 ENCOUNTER — Other Ambulatory Visit: Payer: Self-pay | Admitting: Orthopedic Surgery

## 2024-09-09 DIAGNOSIS — M75121 Complete rotator cuff tear or rupture of right shoulder, not specified as traumatic: Secondary | ICD-10-CM

## 2024-09-15 ENCOUNTER — Ambulatory Visit
Admission: RE | Admit: 2024-09-15 | Discharge: 2024-09-15 | Disposition: A | Discharge status: Home / Self Care | Payer: PRIVATE HEALTH INSURANCE | Source: Ambulatory Visit | Attending: Orthopedic Surgery | Admitting: Orthopedic Surgery

## 2024-09-15 DIAGNOSIS — M75121 Complete rotator cuff tear or rupture of right shoulder, not specified as traumatic: Secondary | ICD-10-CM | POA: Diagnosis present

## 2024-09-21 ENCOUNTER — Other Ambulatory Visit: Payer: Self-pay | Admitting: Surgery

## 2024-09-23 ENCOUNTER — Other Ambulatory Visit: Payer: Self-pay

## 2024-09-23 ENCOUNTER — Inpatient Hospital Stay: Admission: RE | Admit: 2024-09-23 | Payer: PRIVATE HEALTH INSURANCE | Source: Ambulatory Visit

## 2024-09-23 ENCOUNTER — Inpatient Hospital Stay: Admission: RE | Admit: 2024-09-23 | Payer: PRIVATE HEALTH INSURANCE

## 2024-09-23 ENCOUNTER — Encounter
Admission: RE | Admit: 2024-09-23 | Discharge: 2024-09-23 | Disposition: A | Discharge status: Home / Self Care | Payer: PRIVATE HEALTH INSURANCE | Source: Ambulatory Visit | Attending: Surgery | Admitting: Surgery

## 2024-09-23 ENCOUNTER — Encounter: Payer: Self-pay | Admitting: Urgent Care

## 2024-09-23 VITALS — Ht 69.0 in | Wt 216.0 lb

## 2024-09-23 DIAGNOSIS — F17210 Nicotine dependence, cigarettes, uncomplicated: Secondary | ICD-10-CM

## 2024-09-23 DIAGNOSIS — Z01818 Encounter for other preprocedural examination: Secondary | ICD-10-CM | POA: Diagnosis present

## 2024-09-23 DIAGNOSIS — E78019 Familial hypercholesterolemia, unspecified: Secondary | ICD-10-CM

## 2024-09-23 DIAGNOSIS — E875 Hyperkalemia: Secondary | ICD-10-CM

## 2024-09-23 DIAGNOSIS — F17200 Nicotine dependence, unspecified, uncomplicated: Secondary | ICD-10-CM

## 2024-09-23 DIAGNOSIS — I10 Essential (primary) hypertension: Secondary | ICD-10-CM | POA: Diagnosis not present

## 2024-09-23 DIAGNOSIS — E785 Hyperlipidemia, unspecified: Secondary | ICD-10-CM | POA: Diagnosis not present

## 2024-09-23 HISTORY — DX: Anxiety disorder, unspecified: F41.9

## 2024-09-23 HISTORY — DX: Rheumatoid arthritis, unspecified: M06.9

## 2024-09-23 HISTORY — DX: Hyperlipidemia, unspecified: E78.5

## 2024-09-23 HISTORY — DX: Essential (primary) hypertension: I10

## 2024-09-23 HISTORY — DX: Nicotine dependence, unspecified, uncomplicated: F17.200

## 2024-09-23 LAB — BASIC METABOLIC PANEL WITH GFR
Anion gap: 7 (ref 5–15)
BUN: 20 mg/dL (ref 8–23)
CO2: 23 mmol/L (ref 22–32)
Calcium: 9.2 mg/dL (ref 8.9–10.3)
Chloride: 108 mmol/L (ref 98–111)
Creatinine, Ser: 1.14 mg/dL (ref 0.61–1.24)
GFR, Estimated: >60 mL/min (ref >60)
Glucose, Bld: 92 mg/dL (ref 70–99)
Potassium: 3.9 mmol/L (ref 3.5–5.1)
Sodium: 138 mmol/L (ref 135–145)

## 2024-09-23 NOTE — Patient Instructions (Addendum)
 Your procedure is scheduled on: Wednesday 10/05/24 Report to the Registration Desk on the 1st floor of the [facility]. To find out your arrival time, please call [phone] between 1PM - 3PM on: Tuesday 10/04/24 If your arrival time is 6:00 am, do not arrive before that time as the [facility] entrance doors do not open until 6:00 am.  REMEMBER: Instructions that are not followed completely may result in serious medical risk, up to and including death; or upon the discretion of your surgeon and anesthesiologist your surgery may need to be rescheduled.  Do not eat food after midnight the night before surgery.  No gum chewing or hard candies.  You may however, drink CLEAR liquids up to 2 hours before you are scheduled to arrive for your surgery. Do not drink anything within 2 hours of your scheduled arrival time.  Clear liquids include: - water  - apple juice without pulp - gatorade (not RED colors) - black coffee or tea (Do NOT add milk or creamers to the coffee or tea) Do NOT drink anything that is not on this list.  In addition, your doctor has ordered for you to drink the provided:  Ensure Pre-Surgery Clear Carbohydrate Drink  Drinking this carbohydrate drink up to two hours before surgery helps to reduce insulin resistance and improve patient outcomes. Please complete drinking 2 hours before scheduled arrival time.  One week prior to surgery: Stop Anti-inflammatories (NSAIDS) such as Advil, Aleve, Ibuprofen, Motrin, Naproxen, Naprosyn and Aspirin based products such as Excedrin, Goody's Powder, BC Powder.  You may however, continue to take Tylenol if needed for pain up until the day of surgery.  Stop ANY OVER THE COUNTER supplements and vitamins for 7 days until after surgery. (Folic acid)  Continue taking all of your other prescription medications up until the day of surgery.  ON THE DAY OF SURGERY ONLY TAKE THESE MEDICATIONS WITH SIPS OF WATER:  gabapentin (NEURONTIN)  300 MG capsule if needed  No Alcohol for 24 hours before or after surgery.  No Smoking including e-cigarettes for 24 hours before surgery.  No chewable tobacco products for at least 6 hours before surgery.  No nicotine patches on the day of surgery.  Do not use any recreational drugs for at least a week (preferably 2 weeks) before your surgery.  Please be advised that the combination of cocaine and anesthesia may have negative outcomes, up to and including death. If you test positive for cocaine, your surgery will be cancelled.  On the morning of surgery brush your teeth with toothpaste and water, you may rinse your mouth with mouthwash if you wish. Do not swallow any toothpaste or mouthwash.  Use CHG Soap or wipes as directed on instruction sheet.  Do not shave body hair from the neck down 48 hours before surgery.  Do not wear lotions, powders, DEODORANTS or cologne/perfumes on the day of surgery.   Wear comfortable clothing (specific to your surgery type) to the hospital.  Do not wear jewelry, make-up, hairpins, clips or nail polish.  For welded (permanent) jewelry: bracelets, anklets, waist bands, etc.  Please have this removed prior to surgery.  If it is not removed, there is a chance that hospital personnel will need to cut it off on the day of surgery.  Contact lenses, hearing aids and dentures may not be worn into surgery.  Do not bring valuables to the hospital. [facility] is not responsible for any missing/lost belongings or valuables.   Notify your  doctor if there is any change in your medical condition (cold, fever, infection).  After surgery, you can help prevent lung complications by doing breathing exercises.  Take deep breaths and cough every 1-2 hours. Your doctor may order a device called an Incentive Spirometer to help you take deep breaths.  If you are being discharged the day of surgery, you will not be allowed to drive home. You will need a responsible  individual to drive you home and stay with you for 24 hours after surgery.   Please call the Pre-admissions Testing Dept. at [phone] if you have any questions about these instructions.  Surgery Visitation Policy:  Patients having surgery or a procedure may have two visitors.  Children under the age of 56 must have an adult with them who is not the patient.  Merchandiser, retail to address health-related social needs:  [email or web address]                                                                                                             Preparing for Surgery with CHLORHEXIDINE GLUCONATE (CHG) Soap  Chlorhexidine Gluconate (CHG) Soap  o An antiseptic cleaner that kills germs and bonds with the skin to continue killing germs even after washing  o Used for showering the night before surgery and morning of surgery  Before surgery, you can play an important role by reducing the number of germs on your skin.  CHG (Chlorhexidine gluconate) soap is an antiseptic cleanser which kills germs and bonds with the skin to continue killing germs even after washing.  Please do not use if you have an allergy to CHG or antibacterial soaps. If your skin becomes reddened/irritated stop using the CHG.  1. Shower the NIGHT BEFORE SURGERY with CHG soap.  2. If you choose to wash your hair, wash your hair first as usual with your normal shampoo.  3. After shampooing, rinse your hair and body thoroughly to remove the shampoo.  4. Use CHG as you would any other liquid soap. You can apply CHG directly to the skin and wash gently with a clean washcloth.  5. Apply the CHG soap to your body only from the neck down. Do not use on open wounds or open sores. Avoid contact with your eyes, ears, mouth, and genitals (private parts). Wash face and genitals (private parts) with your normal soap.  6. Wash thoroughly, paying special attention to the area where your surgery will be  performed.  7. Thoroughly rinse your body with warm water.  8. Do not shower/wash with your normal soap after using and rinsing off the CHG soap.  9. Do not use lotions, oils, etc., after showering with CHG.  10. Pat yourself dry with a clean towel.  11. Wear clean pajamas to bed the night before surgery.  12. Place clean sheets on your bed the night of your shower and do not sleep with pets.  13. Do not apply any deodorants/lotions/powders.  14. Please wear clean clothes to the hospital.  15. Remember to brush  your teeth with your regular toothpaste.  How to Use an Facilities manager - practice using this 3 times per day for 10 breaths everyday before your surgery  An incentive spirometer is a tool that measures how well you are filling your lungs with each breath. Learning to take long, deep breaths using this tool can help you keep your lungs clear and active. This may help to reverse or lessen your chance of developing breathing (pulmonary) problems, especially infection. You may be asked to use a spirometer: After a surgery. If you have a lung problem or a history of smoking. After a long period of time when you have been unable to move or be active. If the spirometer includes an indicator to show the highest number that you have reached, your health care provider or respiratory therapist will help you set a goal. Keep a log of your progress as told by your health care provider. What are the risks? Breathing too quickly may cause dizziness or cause you to pass out. Take your time so you do not get dizzy or light-headed. If you are in pain, you may need to take pain medicine before doing incentive spirometry. It is harder to take a deep breath if you are having pain. How to use your incentive spirometer  Sit up on the edge of your bed or on a chair. Hold the incentive spirometer so that it is in an upright position. Before you use the spirometer, breathe out normally. Place  the mouthpiece in your mouth. Make sure your lips are closed tightly around it. Breathe in slowly and as deeply as you can through your mouth, causing the piston or the ball to rise toward the top of the chamber. Hold your breath for 3-5 seconds, or for as long as possible. If the spirometer includes a coach indicator, use this to guide you in breathing. Slow down your breathing if the indicator goes above the marked areas. Remove the mouthpiece from your mouth and breathe out normally. The piston or ball will return to the bottom of the chamber. Rest for a few seconds, then repeat the steps 10 or more times. Take your time and take a few normal breaths between deep breaths so that you do not get dizzy or light-headed. Do this every 1-2 hours when you are awake. If the spirometer includes a goal marker to show the highest number you have reached (best effort), use this as a goal to work toward during each repetition. After each set of 10 deep breaths, cough a few times. This will help to make sure that your lungs are clear. If you have an incision on your chest or abdomen from surgery, place a pillow or a rolled-up towel firmly against the incision when you cough. This can help to reduce pain while taking deep breaths and coughing. General tips When you are able to get out of bed: Walk around often. Continue to take deep breaths and cough in order to clear your lungs. Keep using the incentive spirometer until your health care provider says it is okay to stop using it. If you have been in the hospital, you may be told to keep using the spirometer at home. Contact a health care provider if: You are having difficulty using the spirometer. You have trouble using the spirometer as often as instructed. Your pain medicine is not giving enough relief for you to use the spirometer as told. You have a fever. Get help right away if: You  develop shortness of breath. You develop a cough with bloody mucus  from the lungs. You have fluid or blood coming from an incision site after you cough. Summary An incentive spirometer is a tool that can help you learn to take long, deep breaths to keep your lungs clear and active. You may be asked to use a spirometer after a surgery, if you have a lung problem or a history of smoking, or if you have been inactive for a long period of time. Use your incentive spirometer as instructed every 1-2 hours while you are awake. If you have an incision on your chest or abdomen, place a pillow or a rolled-up towel firmly against your incision when you cough. This will help to reduce pain. Get help right away if you have shortness of breath, you cough up bloody mucus, or blood comes from your incision when you cough. This information is not intended to replace advice given to you by your health care provider. Make sure you discuss any questions you have with your health care provider. Document Revised: 02/13/2020 Document Reviewed: 02/13/2020 Elsevier Patient Education  2023 [employer].

## 2024-10-01 ENCOUNTER — Encounter: Payer: Self-pay | Admitting: Emergency Medicine

## 2024-10-01 ENCOUNTER — Inpatient Hospital Stay
Admission: RE | Admit: 2024-10-01 | Discharge: 2024-10-01 | Disposition: A | Discharge status: Home / Self Care | Payer: PRIVATE HEALTH INSURANCE | Source: Ambulatory Visit | Attending: Internal Medicine | Admitting: Internal Medicine

## 2024-10-01 ENCOUNTER — Emergency Department
Admission: EM | Admit: 2024-10-01 | Discharge: 2024-10-01 | Disposition: A | Discharge status: Home / Self Care | Payer: PRIVATE HEALTH INSURANCE | Source: Home / Self Care | Attending: Emergency Medicine | Admitting: Emergency Medicine

## 2024-10-01 ENCOUNTER — Emergency Department: Payer: PRIVATE HEALTH INSURANCE

## 2024-10-01 ENCOUNTER — Other Ambulatory Visit: Payer: Self-pay

## 2024-10-01 DIAGNOSIS — R0602 Shortness of breath: Secondary | ICD-10-CM | POA: Insufficient documentation

## 2024-10-01 DIAGNOSIS — A419 Sepsis, unspecified organism: Secondary | ICD-10-CM | POA: Diagnosis not present

## 2024-10-01 DIAGNOSIS — R110 Nausea: Secondary | ICD-10-CM | POA: Insufficient documentation

## 2024-10-01 DIAGNOSIS — R051 Acute cough: Secondary | ICD-10-CM | POA: Insufficient documentation

## 2024-10-01 DIAGNOSIS — J9601 Acute respiratory failure with hypoxia: Secondary | ICD-10-CM | POA: Diagnosis not present

## 2024-10-01 LAB — CBC
HCT: 41.5 % (ref 39.0–52.0)
Hemoglobin: 14.5 g/dL (ref 13.0–17.0)
MCH: 33.4 pg (ref 26.0–34.0)
MCHC: 34.9 g/dL (ref 30.0–36.0)
MCV: 95.6 fL (ref 80.0–100.0)
Platelets: 271 K/uL (ref 150–400)
RBC: 4.34 MIL/uL (ref 4.22–5.81)
RDW: 13.6 % (ref 11.5–15.5)
WBC: 9.9 K/uL (ref 4.0–10.5)
nRBC: 0 % (ref 0.0–0.2)

## 2024-10-01 LAB — BASIC METABOLIC PANEL WITH GFR
Anion gap: 14 (ref 5–15)
BUN: 20 mg/dL (ref 8–23)
CO2: 22 mmol/L (ref 22–32)
Calcium: 9.1 mg/dL (ref 8.9–10.3)
Chloride: 101 mmol/L (ref 98–111)
Creatinine, Ser: 1.19 mg/dL (ref 0.61–1.24)
GFR, Estimated: >60 mL/min (ref >60)
Glucose, Bld: 111 mg/dL — ABNORMAL HIGH (ref 70–99)
Potassium: 3.9 mmol/L (ref 3.5–5.1)
Sodium: 137 mmol/L (ref 135–145)

## 2024-10-01 LAB — RESP PANEL BY RT-PCR (RSV, FLU A&B, COVID)  RVPGX2
Influenza A by PCR: NEGATIVE
Influenza B by PCR: NEGATIVE
Resp Syncytial Virus by PCR: NEGATIVE
SARS Coronavirus 2 by RT PCR: NEGATIVE

## 2024-10-01 LAB — TROPONIN I (HIGH SENSITIVITY): Troponin I (High Sensitivity): 4 ng/L (ref <18)

## 2024-10-01 MED ORDER — DOXYCYCLINE HYCLATE 100 MG PO TABS: 100.0000 mg | ORAL_TABLET | Freq: Two times a day (BID) | ORAL | 0 refills | Status: AC

## 2024-10-01 MED ORDER — ONDANSETRON 4 MG PO TBDP: 4.0000 mg | ORAL_TABLET | Freq: Three times a day (TID) | ORAL | 0 refills | Status: DC | PRN

## 2024-10-01 NOTE — Discharge Instructions (Signed)
 I suspect this is most likely a viral illness but given the symptoms have been ongoing for over a week we discussed a low-grade antibiotic to try to help in case there is a component that is in your lungs although your x-ray and blood work was reassuring.  Do not see any signs of a severe infection.

## 2024-10-01 NOTE — ED Triage Notes (Signed)
 Pt reports flu shot 3 weeks ago and PNA shot a week ago. Chills, low grade fever and SOB for a week. Denies any new pain, due for surgery on Wednesday for rotator cuff.

## 2024-10-01 NOTE — ED Provider Notes (Signed)
 [facility] Provider Note    Event Date/Time   First MD Initiated Contact with Patient 10/01/24 1424     (approximate)   History   Shortness of Breath   HPI  Edwin Ali is a 63 y.o. male with arthritis who comes in with concerns for shortness of breath.  Patient is on methotrexate.  He does report some shortness of breath and coughing for the past 1 week. Non productive  He does report having his pneumonia shot a week ago and his flu shot 3 weeks ago.  He has reported a low-grade temperature around 100 tpday and did take Tylenol before coming in.  He denies any chest pain.  He does report having plans for surgery on Wednesday for his rotator cuff.  He denies any swelling in his legs, calf tenderness.  He does report a little bit of nauseousness and queasiness associated with it and just feeling like he has been feeling sick.   Physical Exam   Triage Vital Signs: ED Triage Vitals  Encounter Vitals Group     BP 10/01/24 1352 (!) 142/98     Girls Systolic BP Percentile --      Girls Diastolic BP Percentile --      Boys Systolic BP Percentile --      Boys Diastolic BP Percentile --      Pulse Rate 10/01/24 1352 97     Resp 10/01/24 1352 18     Temp 10/01/24 1352 98.1 F (36.7 C)     Temp Source 10/01/24 1352 Oral     SpO2 10/01/24 1352 96 %     Weight 10/01/24 1354 213 lb 13.5 oz (97 kg)     Height 10/01/24 1354 5' 9 (1.753 m)     Head Circumference --      Peak Flow --      Pain Score 10/01/24 1537 0     Pain Loc --      Pain Education --      Exclude from Growth Chart --     Most recent vital signs: Vitals:   10/01/24 1352 10/01/24 1541  BP: (!) 142/98 (!) 143/97  Pulse: 97 86  Resp: 18 16  Temp: 98.1 F (36.7 C)   SpO2: 96% 96%     General: Awake, no distress.  CV:  Good peripheral perfusion.  Resp:  Normal effort.  Clear lungs Abd:  No distention.  Soft and nontender Other:  No swelling legs.  No calf tenderness   ED  Results / Procedures / Treatments   Labs (all labs ordered are listed, but only abnormal results are displayed) Labs Reviewed  BASIC METABOLIC PANEL WITH GFR - Abnormal; Notable for the following components:      Result Value   Glucose, Bld 111 (*)    All other components within normal limits  RESP PANEL BY RT-PCR (RSV, FLU A&B, COVID)  RVPGX2  CBC  TROPONIN I (HIGH SENSITIVITY)     EKG  My interpretation of EKG:  Normal sinus rhythm 90 without any ST elevation or T wave inversions, normal intervals  RADIOLOGY I have reviewed the xray personally and interpreted no evidence of any pneumonia does have some bronchial thickening   PROCEDURES:  Critical Care performed: No  Procedures   MEDICATIONS ORDERED IN ED: Medications - No data to display   IMPRESSION / MDM / ASSESSMENT AND PLAN / ED COURSE  I reviewed the triage vital signs and the nursing notes.  Patient's presentation is most consistent with acute presentation with potential threat to life or bodily function.   Patient comes in with concerns for shortness of breath, coughing, fevers feeling like he has been ill this seems most consistent with possible viral versus bacterial pneumonia.  Workup was done to evaluate for COVID, flu, pneumonia, cardiac markers evaluate for ACS.  Consider the possibility of PE but no symptoms of DVT he denies any prior history of this and with these infectious symptoms this seems less likely.  He is not hypoxic and not tachycardic.  COVID and flu test were negative.  His BMP was reassuring CBC was reassuring troponin was negative  Reevaluated patient recheck temperature remains afebrile at 98 patient is very well-appearing not tachycardic white count was normal.  Given the reported some temperatures at home in the 100s I considered the possibility of sepsis but patient is very well-appearing.  We discussed CT imaging to evaluate for PE, pneumonia being missed but given patient is very  well-appearing and my suspicion for PE is very low they were okay with holding off.  We will try some outpatient antibiotics given immunosuppression but we did discuss this could just be viral in nature.  They expressed understanding.  They did report him having some nausea and chills and other symptoms that seems most consistent with infectious process.  At this time they want to be started on some antibiotics and will follow-up with their primary care doctor and return to the ER if he develops worsening shortness of breath or any other concerns  The patient is on the cardiac monitor to evaluate for evidence of arrhythmia and/or significant heart rate changes.      FINAL CLINICAL IMPRESSION(S) / ED DIAGNOSES   Final diagnoses:  Shortness of breath  Acute cough     Rx / DC Orders   ED Discharge Orders     None        Note:  This document was prepared using Dragon voice recognition software and may include unintentional dictation errors.   Ernest Ronal BRAVO, MD 10/01/24 [phone]

## 2024-10-02 ENCOUNTER — Inpatient Hospital Stay
Admission: EM | Admit: 2024-10-02 | Discharge: 2024-10-07 | DRG: 871 | Disposition: A | Discharge status: Home / Self Care | Payer: PRIVATE HEALTH INSURANCE | Attending: Internal Medicine | Admitting: Internal Medicine

## 2024-10-02 ENCOUNTER — Other Ambulatory Visit: Payer: Self-pay

## 2024-10-02 ENCOUNTER — Emergency Department: Payer: PRIVATE HEALTH INSURANCE

## 2024-10-02 DIAGNOSIS — J159 Unspecified bacterial pneumonia: Secondary | ICD-10-CM | POA: Diagnosis present

## 2024-10-02 DIAGNOSIS — Z1152 Encounter for screening for COVID-19: Secondary | ICD-10-CM | POA: Diagnosis not present

## 2024-10-02 DIAGNOSIS — I10 Essential (primary) hypertension: Secondary | ICD-10-CM | POA: Diagnosis present

## 2024-10-02 DIAGNOSIS — Z79631 Long term (current) use of antimetabolite agent: Secondary | ICD-10-CM

## 2024-10-02 DIAGNOSIS — Z8249 Family history of ischemic heart disease and other diseases of the circulatory system: Secondary | ICD-10-CM

## 2024-10-02 DIAGNOSIS — J9601 Acute respiratory failure with hypoxia: Secondary | ICD-10-CM | POA: Diagnosis present

## 2024-10-02 DIAGNOSIS — A419 Sepsis, unspecified organism: Secondary | ICD-10-CM | POA: Diagnosis present

## 2024-10-02 DIAGNOSIS — Z79899 Other long term (current) drug therapy: Secondary | ICD-10-CM | POA: Diagnosis not present

## 2024-10-02 DIAGNOSIS — Z87891 Personal history of nicotine dependence: Secondary | ICD-10-CM

## 2024-10-02 DIAGNOSIS — E785 Hyperlipidemia, unspecified: Secondary | ICD-10-CM | POA: Diagnosis present

## 2024-10-02 DIAGNOSIS — Z720 Tobacco use: Secondary | ICD-10-CM

## 2024-10-02 DIAGNOSIS — M069 Rheumatoid arthritis, unspecified: Secondary | ICD-10-CM | POA: Diagnosis present

## 2024-10-02 DIAGNOSIS — J129 Viral pneumonia, unspecified: Secondary | ICD-10-CM | POA: Diagnosis present

## 2024-10-02 DIAGNOSIS — J189 Pneumonia, unspecified organism: Principal | ICD-10-CM

## 2024-10-02 DIAGNOSIS — M1990 Unspecified osteoarthritis, unspecified site: Secondary | ICD-10-CM | POA: Diagnosis present

## 2024-10-02 DIAGNOSIS — Z9189 Other specified personal risk factors, not elsewhere classified: Secondary | ICD-10-CM

## 2024-10-02 DIAGNOSIS — R911 Solitary pulmonary nodule: Secondary | ICD-10-CM | POA: Diagnosis present

## 2024-10-02 LAB — COMPREHENSIVE METABOLIC PANEL WITH GFR
ALT: 19 U/L (ref 0–44)
AST: 22 U/L (ref 15–41)
Albumin: 3.6 g/dL (ref 3.5–5.0)
Alkaline Phosphatase: 71 U/L (ref 38–126)
Anion gap: 12 (ref 5–15)
BUN: 18 mg/dL (ref 8–23)
CO2: 21 mmol/L — ABNORMAL LOW (ref 22–32)
Calcium: 9 mg/dL (ref 8.9–10.3)
Chloride: 102 mmol/L (ref 98–111)
Creatinine, Ser: 1.31 mg/dL — ABNORMAL HIGH (ref 0.61–1.24)
GFR, Estimated: >60 mL/min (ref >60)
Glucose, Bld: 110 mg/dL — ABNORMAL HIGH (ref 70–99)
Potassium: 3.9 mmol/L (ref 3.5–5.1)
Sodium: 135 mmol/L (ref 135–145)
Total Bilirubin: 0.8 mg/dL (ref 0.0–1.2)
Total Protein: 7.6 g/dL (ref 6.5–8.1)

## 2024-10-02 LAB — CBC
HCT: 42.4 % (ref 39.0–52.0)
Hemoglobin: 14.5 g/dL (ref 13.0–17.0)
MCH: 32.9 pg (ref 26.0–34.0)
MCHC: 34.2 g/dL (ref 30.0–36.0)
MCV: 96.1 fL (ref 80.0–100.0)
Platelets: 253 K/uL (ref 150–400)
RBC: 4.41 MIL/uL (ref 4.22–5.81)
RDW: 13.6 % (ref 11.5–15.5)
WBC: 10.2 K/uL (ref 4.0–10.5)
nRBC: 0 % (ref 0.0–0.2)

## 2024-10-02 LAB — BRAIN NATRIURETIC PEPTIDE: B Natriuretic Peptide: 10.5 pg/mL (ref 0.0–100.0)

## 2024-10-02 LAB — LACTIC ACID, PLASMA
Lactic Acid, Venous: 1.3 mmol/L (ref 0.5–1.9)
Lactic Acid, Venous: 1.4 mmol/L (ref 0.5–1.9)

## 2024-10-02 LAB — TROPONIN I (HIGH SENSITIVITY): Troponin I (High Sensitivity): 6 ng/L (ref <18)

## 2024-10-02 MED ORDER — ATORVASTATIN CALCIUM 20 MG PO TABS
10.0000 mg | ORAL_TABLET | Freq: Every day | ORAL | Status: DC
  Administered 2024-10-02 – 2024-10-06 (×5): 10 mg via ORAL
  Filled 2024-10-02 (×5): qty 1

## 2024-10-02 MED ORDER — SODIUM CHLORIDE 0.9 % IV SOLN
1.0000 g | Freq: Once | INTRAVENOUS | Status: AC
  Administered 2024-10-02: 1 g via INTRAVENOUS
  Filled 2024-10-02: qty 10

## 2024-10-02 MED ORDER — ONDANSETRON HCL 4 MG/2ML IJ SOLN: 4.0000 mg | Freq: Four times a day (QID) | INTRAMUSCULAR | Status: AC | PRN

## 2024-10-02 MED ORDER — GABAPENTIN 300 MG PO CAPS
300.0000 mg | ORAL_CAPSULE | Freq: Two times a day (BID) | ORAL | Status: DC | PRN
  Administered 2024-10-04 – 2024-10-05 (×3): 300 mg via ORAL
  Filled 2024-10-02 (×3): qty 1

## 2024-10-02 MED ORDER — ACETAMINOPHEN 325 MG PO TABS
650.0000 mg | ORAL_TABLET | Freq: Four times a day (QID) | ORAL | Status: AC | PRN
  Administered 2024-10-02 – 2024-10-05 (×5): 650 mg via ORAL
  Filled 2024-10-02 (×5): qty 2

## 2024-10-02 MED ORDER — AMLODIPINE BESYLATE 5 MG PO TABS
5.0000 mg | ORAL_TABLET | Freq: Every evening | ORAL | Status: DC
  Administered 2024-10-02 – 2024-10-06 (×5): 5 mg via ORAL
  Filled 2024-10-02 (×5): qty 1

## 2024-10-02 MED ORDER — SODIUM CHLORIDE 0.9 % IV SOLN
1.0000 g | INTRAVENOUS | Status: AC
  Administered 2024-10-03 – 2024-10-06 (×4): 1 g via INTRAVENOUS
  Filled 2024-10-02 (×4): qty 10

## 2024-10-02 MED ORDER — SODIUM CHLORIDE 0.9 % IV SOLN
500.0000 mg | INTRAVENOUS | Status: DC
  Administered 2024-10-03 – 2024-10-04 (×2): 500 mg via INTRAVENOUS
  Filled 2024-10-02 (×3): qty 5

## 2024-10-02 MED ORDER — IPRATROPIUM-ALBUTEROL 0.5-2.5 (3) MG/3ML IN SOLN: 3.0000 mL | Freq: Four times a day (QID) | RESPIRATORY_TRACT | Status: AC | PRN

## 2024-10-02 MED ORDER — ONDANSETRON HCL 4 MG PO TABS: 4.0000 mg | ORAL_TABLET | Freq: Four times a day (QID) | ORAL | Status: AC | PRN

## 2024-10-02 MED ORDER — IOHEXOL 350 MG/ML SOLN
80.0000 mL | Freq: Once | INTRAVENOUS | Status: AC | PRN
  Administered 2024-10-02: 75 mL via INTRAVENOUS

## 2024-10-02 MED ORDER — ACETAMINOPHEN 650 MG RE SUPP: 650.0000 mg | Freq: Four times a day (QID) | RECTAL | Status: AC | PRN

## 2024-10-02 MED ORDER — AMLODIPINE BESYLATE 5 MG PO TABS: 5.0000 mg | ORAL_TABLET | Freq: Every evening | ORAL | Status: DC

## 2024-10-02 MED ORDER — SENNOSIDES-DOCUSATE SODIUM 8.6-50 MG PO TABS
1.0000 | ORAL_TABLET | Freq: Every evening | ORAL | Status: DC | PRN
  Administered 2024-10-05: 1 via ORAL
  Filled 2024-10-02: qty 1

## 2024-10-02 MED ORDER — SODIUM CHLORIDE 0.9 % IV SOLN
500.0000 mg | Freq: Once | INTRAVENOUS | Status: AC
  Administered 2024-10-02: 500 mg via INTRAVENOUS
  Filled 2024-10-02: qty 5

## 2024-10-02 MED ORDER — FOLIC ACID 1 MG PO TABS
1.0000 mg | ORAL_TABLET | Freq: Every day | ORAL | Status: DC
  Administered 2024-10-04 – 2024-10-07 (×4): 1 mg via ORAL
  Filled 2024-10-02 (×5): qty 1

## 2024-10-02 MED ORDER — ENOXAPARIN SODIUM 60 MG/0.6ML IJ SOSY
47.5000 mg | PREFILLED_SYRINGE | INTRAMUSCULAR | Status: DC
  Administered 2024-10-02 – 2024-10-05 (×4): 47.5 mg via SUBCUTANEOUS
  Filled 2024-10-02 (×5): qty 0.6

## 2024-10-02 NOTE — Hospital Course (Signed)
 Mr. Edwin Ali is a 63 year old male with history of hypertension, rheumatoid arthritis on methotrexate, presents for chief concerns of oxygen saturation dropping in the 70s at home.  Vitals in the ED showed t 99.4, rr 20, hr initially 112, currently improved to 84, blood pressure 128/97, SpO2 96% on room air and then desatted to 88% in the emergency department.  Patient was placed on 2 L nasal cannula with improvement to 93%.  Serum sodium is 135, potassium 3.9, chloride 102, bicarb 21, BUN of 18, serum creatinine 1.31, eGFR greater than 60, nonfasting blood glucose 110, WBC 10.2, hemoglobin 14.5, platelets of 253.  COVID/influenza A/influenza B/RSV PCR were negative.  ED treatment: Azithromycin 500 mg IV one-time dose, ceftriaxone 1 g IV one-time dose.

## 2024-10-02 NOTE — ED Triage Notes (Signed)
 Pt presents to the ED via POV from home with wife. Pt was here yesterday with chills, headache and fever. Pt reports that his oxygen saturation dropped this morning into the 70's. Pt's O2 saturation 94% on RA at time of triage. Pt was started on an antibiotic last night for possible infection. Pt reports generalized body pain.

## 2024-10-02 NOTE — Assessment & Plan Note (Signed)
 Patient SpO2 desatted while sleeping in the emergency department and his wife states that he has been snoring louder over the last few months I suspect patient has sleep apnea Extensive discussion with patient at bedside to follow-up with PCP and referral for pulmonologist outpatient for consideration of outpatient sleep study CPAP nightly ordered on admission

## 2024-10-02 NOTE — ED Notes (Signed)
 Pt transported to CT ?

## 2024-10-02 NOTE — Assessment & Plan Note (Signed)
 -  Atorvastatin 10 mg nightly resumed

## 2024-10-02 NOTE — ED Notes (Signed)
 Advised nurse that patient has ready bed

## 2024-10-02 NOTE — ED Provider Notes (Signed)
 [facility] Provider Note    Event Date/Time   First MD Initiated Contact with Patient 10/02/24 [phone]     (approximate)   History   Shortness of Breath  HPI  Edwin Ali is a 63 y.o. male with a history of smoking and of rheumatoid arthritis, stopped his prednisone and methotrexate 1 week ago in preparation for a surgery who presents with shortness of breath.  Seen here yesterday per review of records, had reassuring workup, started on antibiotics.  He reports his breathing worsened overnight.  He did have chills at home     Physical Exam   Triage Vital Signs: ED Triage Vitals  Encounter Vitals Group     BP 10/02/24 0807 136/85     Girls Systolic BP Percentile --      Girls Diastolic BP Percentile --      Boys Systolic BP Percentile --      Boys Diastolic BP Percentile --      Pulse Rate 10/02/24 0807 (!) 117     Resp 10/02/24 0807 (!) 25     Temp 10/02/24 0807 99.4 F (37.4 C)     Temp Source 10/02/24 0807 Oral     SpO2 10/02/24 0807 96 %     Weight 10/02/24 0808 97 kg (213 lb 13.5 oz)     Height 10/02/24 0808 1.753 m (5' 9)     Head Circumference --      Peak Flow --      Pain Score 10/02/24 0808 4     Pain Loc --      Pain Education --      Exclude from Growth Chart --     Most recent vital signs: Vitals:   10/02/24 1000 10/02/24 1030  BP: (!) 142/87 134/86  Pulse: 91 84  Resp: (!) 22 15  Temp:    SpO2: 93% 95%     General: Awake, no distress.  CV:  Good peripheral perfusion.  Tachycardia Resp:  Tachypnea, room air oxygen saturation 93%, no significant wheezing Abd:  No distention.  Other:     ED Results / Procedures / Treatments   Labs (all labs ordered are listed, but only abnormal results are displayed) Labs Reviewed  COMPREHENSIVE METABOLIC PANEL WITH GFR - Abnormal; Notable for the following components:      Result Value   CO2 21 (*)    Glucose, Bld 110 (*)    Creatinine, Ser 1.31 (*)    All other  components within normal limits  CULTURE, BLOOD (ROUTINE X 2)  CULTURE, BLOOD (ROUTINE X 2)  CBC  BRAIN NATRIURETIC PEPTIDE  LACTIC ACID, PLASMA  LACTIC ACID, PLASMA  TROPONIN I (HIGH SENSITIVITY)     EKG  ED ECG REPORT I, Lamar Price, the attending physician, personally viewed and interpreted this ECG.  Date: 10/02/2024  Rhythm: Sinus tachycardia QRS Axis: normal Intervals: normal ST/T Wave abnormalities: normal Narrative Interpretation: no evidence of acute ischemia    RADIOLOGY Chest x-ray without acute abnormality    PROCEDURES:  Critical Care performed: yes  CRITICAL CARE Performed by: Lamar Price   Total critical care time: 30 minutes  Critical care time was exclusive of separately billable procedures and treating other patients.  Critical care was necessary to treat or prevent imminent or life-threatening deterioration.  Critical care was time spent personally by me on the following activities: development of treatment plan with patient and/or surrogate as well as nursing, discussions with consultants, evaluation of  patient's response to treatment, examination of patient, obtaining history from patient or surrogate, ordering and performing treatments and interventions, ordering and review of laboratory studies, ordering and review of radiographic studies, pulse oximetry and re-evaluation of patient's condition.   Procedures   MEDICATIONS ORDERED IN ED: Medications  cefTRIAXone (ROCEPHIN) 1 g in sodium chloride  0.9 % 100 mL IVPB (1 g Intravenous New Bag/Given 10/02/24 1108)  azithromycin (ZITHROMAX) 500 mg in sodium chloride  0.9 % 250 mL IVPB (has no administration in time range)  iohexol (OMNIPAQUE) 350 MG/ML injection 80 mL (75 mLs Intravenous Contrast Given 10/02/24 0947)     IMPRESSION / MDM / ASSESSMENT AND PLAN / ED COURSE  I reviewed the triage vital signs and the nursing notes. Patient's presentation is most consistent with acute  presentation with potential threat to life or bodily function.  Patient with a long history of cigarette smoking, quit 5 weeks ago presents with shortness of breath, chills.  Had reassuring workup yesterday started on antibiotics, had 1 dose, breathing worsened overnight, reported to be hypoxic at home using home pulse ox.  Here he is tachypneic, tachycardic with room air saturation around 93% while at rest, concerning for pneumonia given chills.  Will repeat workup  Notified by nurse of pulse oximetry 88% while in bed, will start 2 L nasal cannula  X-ray is unremarkable, will send for CT angiography  CT scan negative for PE but questionable pneumonitis, given chills will cover with IV Rocephin, IV azithromycin  Have discussed with Dr. Sherre of the hospitalist team      FINAL CLINICAL IMPRESSION(S) / ED DIAGNOSES   Final diagnoses:  Community acquired pneumonia, unspecified laterality  Acute respiratory failure with hypoxia (HCC)     Rx / DC Orders   ED Discharge Orders     None        Note:  This document was prepared using Dragon voice recognition software and may include unintentional dictation errors.   Arlander [place], MD 10/02/24 1113

## 2024-10-02 NOTE — H&P (Signed)
 History and Physical   Edwin Ali FMW:[id number] DOB: 1961/03/10 DOA: 10/02/2024  PCP: Sherial Bail, MD Patient coming from: Home  I have personally briefly reviewed patient's old medical records in [facility] EMR.  Chief Concern: Oxygen saturation dropping  HPI: Edwin Ali is a 63 year old male with history of hypertension, rheumatoid arthritis on methotrexate, presents for chief concerns of oxygen saturation dropping in the 70s at home.  Vitals in the ED showed t 99.4, rr 20, hr initially 112, currently improved to 84, blood pressure 128/97, SpO2 96% on room air and then desatted to 88% in the emergency department.  Patient was placed on 2 L nasal cannula with improvement to 93%.  Serum sodium is 135, potassium 3.9, chloride 102, bicarb 21, BUN of 18, serum creatinine 1.31, eGFR greater than 60, nonfasting blood glucose 110, WBC 10.2, hemoglobin 14.5, platelets of 253.  COVID/influenza A/influenza B/RSV PCR were negative.  ED treatment: Azithromycin 500 mg IV one-time dose, ceftriaxone 1 g IV one-time dose. ---------------------------------------- At bedside, is able to tell me his first and last name, age, location, current calendar year.  He has a home pulse ox device.  Patient reported that his oxygen desatted to 94s yesterday while at home and then when he was on his way to the emergency department, while sitting in their car, his oxygen desatted to the 70s.  At bedside, he denies shortness of breath, chest pain, dysuria, hematuria, nausea, vomiting, fever, chills.  He reports that he did feel short of breath right before falling asleep.  Social history: He lives at home with his wife.  He denies tobacco, EtOH, recreational drug use.  He is about to fully retire in the next few weeks.  ROS: Constitutional: no weight change, no fever ENT/Mouth: no sore throat, no rhinorrhea Eyes: no eye pain, no vision changes Cardiovascular: no chest pain, + dyspnea,   no edema, no palpitations Respiratory: no cough, no sputum, no wheezing Gastrointestinal: no nausea, no vomiting, no diarrhea, no constipation Genitourinary: no urinary incontinence, no dysuria, no hematuria Musculoskeletal: no arthralgias, no myalgias Skin: no skin lesions, no pruritus, Neuro: + weakness, no loss of consciousness, no syncope Psych: no anxiety, no depression, + decrease appetite Heme/Lymph: no bruising, no bleeding  ED Course: Discussed with EDP, patient requiring hospitalization for chief concerns of acute hypoxic respiratory failure.  Assessment/Plan  Principal Problem:   Acute hypoxic respiratory failure (HCC) Active Problems:   Essential hypertension   Hyperlipidemia   At risk for sleep apnea   Assessment and Plan:  * Acute hypoxic respiratory failure (HCC) Suspect multifactorial including possible viral pneumonia versus bacterial pneumonia as patient is immunocompromise in setting of methotrexate use Complicated by likely undiagnosed sleep apnea, and diffuse heterogenous ground glass attenuation in multiple lobes with mild emphysema and diffuse bronchial wall thickening. CPAP nightly ordered Continuous pulse oximetry Ceftriaxone 1 g IV daily, azithromycin 500 mg IV daily to complete a 5-day course  At risk for sleep apnea Patient SpO2 desatted while sleeping in the emergency department and his wife states that he has been snoring louder over the last few months I suspect patient has sleep apnea Extensive discussion with patient at bedside to follow-up with PCP and referral for pulmonologist outpatient for consideration of outpatient sleep study CPAP nightly ordered on admission  Hyperlipidemia Atorvastatin 10 mg nightly resumed  Essential hypertension Amlodipine 5 mg every evening resume  Chart reviewed.   DVT prophylaxis: Enoxaparin Code Status: full code  Diet: Heart healthy Family Communication:  Updated spouse at bedside, [place] with patient's  permission Disposition Plan: Clinical course Consults called: none at this time   Admission status: Telemetry medical, inpatient  Past Medical History:  Diagnosis Date   Anxiety    HLD (hyperlipidemia)    Hypertension    Rheumatoid arthritis (HCC)    Smoker    Past Surgical History:  Procedure Laterality Date   COLONOSCOPY     Social History:  reports that he quit smoking about 5 weeks ago. His smoking use included cigarettes. He started smoking about 48 years ago. He has a 48 pack-year smoking history. He has quit using smokeless tobacco. He reports current alcohol use. He reports that he does not currently use drugs after having used the following drugs: Morphine.  No Known Allergies Family History  Problem Relation Age of Onset   Hypertension Mother    Family history: Family history reviewed and not pertinent.  Prior to Admission medications   Medication Sig Start Date End Date Taking? Authorizing Provider  amLODipine (NORVASC) 5 MG tablet Take 5 mg by mouth at bedtime. 02/03/24 02/02/25 Yes [provider]  atorvastatin (LIPITOR) 10 MG tablet Take 10 mg by mouth at bedtime.   Yes [provider]  methotrexate (RHEUMATREX) 2.5 MG tablet Take 20 mg by mouth once a week. 08/10/24  Yes [provider]  predniSONE (DELTASONE) 5 MG tablet Take 5 mg by mouth daily. 08/10/24  Yes [provider]  acetaminophen (TYLENOL) 325 MG tablet Take 650 mg by mouth every 6 (six) hours as needed for moderate pain (pain score 4-6).    [provider]  doxycycline (VIBRA-TABS) 100 MG tablet Take 1 tablet (100 mg total) by mouth 2 (two) times daily for 7 days. 10/01/24 10/08/24  Ernest Ronal BRAVO, MD  ENBREL SURECLICK 50 MG/ML injection Inject 50 mg into the skin once a week. 09/01/24   [provider]  folic acid (FOLVITE) 1 MG tablet Take 1 mg by mouth daily.    [provider]  gabapentin (NEURONTIN) 300 MG capsule Take 300 mg by mouth 2 (two)  times daily as needed (pain). 07/05/24 07/05/25  [provider]  ketoconazole (NIZORAL) 2 % cream Apply 1 Application topically 2 (two) times daily as needed for irritation. 07/05/24 07/05/25  [provider]  metaxalone (SKELAXIN) 800 MG tablet Take 800 mg by mouth 3 (three) times daily as needed for muscle spasms. Patient not taking: Reported on 09/23/2024 08/19/24   [provider]  ondansetron (ZOFRAN-ODT) 4 MG disintegrating tablet Take 1 tablet (4 mg total) by mouth every 8 (eight) hours as needed for up to 5 days. 10/01/24 10/06/24  Ernest Ronal BRAVO, MD  sildenafil  (VIAGRA ) 50 MG tablet Take 1 tablet (50 mg total) by mouth daily as needed for erectile dysfunction. 08/05/24   Georganne Penne SAUNDERS, MD   Physical Exam: Vitals:   10/02/24 1100 10/02/24 1130 10/02/24 1200 10/02/24 1316  BP: (!) 138/98 (!) 125/90 134/82   Pulse: 95 88 78   Resp: (!) 22 15 (!) 24   Temp:      TempSrc:      SpO2: 93% 94% 90%   Weight:    97 kg  Height:    5' 9 (1.753 m)   Constitutional: appears age-appropriate, mildly anxious Eyes: PERRL, lids and conjunctivae normal ENMT: Mucous membranes are moist. Posterior pharynx clear of any exudate or lesions. Age-appropriate dentition. Hearing appropriate Neck: normal, supple, no masses, no thyromegaly Respiratory: Generalized decreased lung sounds  bilaterally, no wheezing, no crackles. Normal respiratory effort.  Mild increased use accessory muscle.  Cardiovascular: Regular rate and rhythm, no murmurs / rubs / gallops. No extremity edema. 2+ pedal pulses. No carotid bruits.  Abdomen: no tenderness, no masses palpated, no hepatosplenomegaly. Bowel sounds positive.  Musculoskeletal: no clubbing / cyanosis. No joint deformity upper and lower extremities. Good ROM, no contractures, no atrophy. Normal muscle tone.  Skin: no rashes, lesions, ulcers. No induration Neurologic: Sensation intact. Strength 5/5 in all 4.  Psychiatric: Normal judgment and  insight. Alert and oriented x 3. Normal mood.   EKG: independently reviewed, showing sinus tachycardia with rate of 113, QTc 460  Chest x-ray on Admission: I personally reviewed and I agree with radiologist reading as below.  CT Angio Chest PE W and/or Wo Contrast Result Date: 10/02/2024 EXAM: CTA of the Chest with contrast for PE 10/02/2024 09:54:16 AM TECHNIQUE: CTA of the chest was performed after the administration of 75 mL of iohexol (OMNIPAQUE) 350 MG/ML injection. Multiplanar reformatted images are provided for review. MIP images are provided for review. Automated exposure control, iterative reconstruction, and/or weight based adjustment of the mA/kV was utilized to reduce the radiation dose to as low as reasonably achievable. COMPARISON: 06/10/2003. CLINICAL HISTORY: Pulmonary embolism (PE) suspected, high prob. Pt presents to the ED via POV from home with wife. Pt was here yesterday with chills, headache and fever. Pt reports that his oxygen saturation dropped this morning into the 70's. Pt's O2 saturation 94% on RA at time of triage. Pt was started on an antibiotic last night for possible infection. Pt reports generalized body pain. FINDINGS: PULMONARY ARTERIES: Pulmonary arteries are adequately opacified for evaluation. No pulmonary embolism. Main pulmonary artery is normal in caliber. MEDIASTINUM: The heart demonstrates coronary artery calcifications. No pericardial effusion. Mild aortic atherosclerosis. There is no acute abnormality of the thoracic aorta. LYMPH NODES: Prominent bilateral hilar nodes are identified. Index right hilar node measures 1 cm (image 153/5). Calcified right hilar lymph nodes. No mediastinal or axillary lymphadenopathy. LUNGS AND PLEURA: There is diffuse heterogeneous ground-glass attenuation within the upper and lower lung zones bilaterally. Mild emphysema with diffuse bronchial wall thickening. Scar versus subsegmental atelectasis noted within the lingula. Right upper  lobe granulomas again identified. New nodule within the right lower lobe measures 5 mm (image 80/6). Scattered areas of air trapping identified, indicating small airways disease. No significant pleural effusion. No pneumothorax. UPPER ABDOMEN: No acute findings within the limited imaging through the upper abdomen. SOFT TISSUES AND BONES: No acute or suspicious osseous findings. No acute soft tissue abnormality. IMPRESSION: 1. No pulmonary embolism. 2. Diffuse bilateral ground-glass attenuation. Differential considerations include atypical viral infection versus nonspecific inflammatory pneumonitis. Pulmonary edema may have a similar appearance. 3. New 5 mm right lower lobe pulmonary nodule.In a patient who was at low risk, no further follow-up is indicated. If the patient is at increased risk a follow-up CT of the chest without contrast material in 12 months may be considered 4. Prominent bilateral hilar lymph nodes. Likely reactive. 5. Aortic atherosclerosis and coronary artery calcifications. Electronically signed by: Waddell Calk MD 10/02/2024 10:23 AM EDT RP Workstation: [id number]   DG Chest 2 View Result Date: 10/02/2024 EXAM: 2 VIEW(S) XRAY OF THE CHEST 10/02/2024 08:35:00 AM COMPARISON: 10/01/2024 CLINICAL HISTORY: sob. Pt here yesterday with chills, headache and fever. Pt reports that his oxygen saturation dropped this morning into the 70's. Pt was started on an antibiotic last night for possible infection. Pt reports generalized body pain,  slight nausea, and SOB with ; movement. FINDINGS: LUNGS AND PLEURA: No focal pulmonary opacity. No pulmonary edema. No pleural effusion. No pneumothorax. HEART AND MEDIASTINUM: No acute abnormality of the cardiac and mediastinal silhouettes. BONES AND SOFT TISSUES: Multilevel bridging osteophytes in thoracic spine. IMPRESSION: 1. No acute cardiopulmonary process. Electronically signed by: Waddell Calk MD 10/02/2024 08:52 AM EDT RP Workstation: [id number]   DG  Chest 2 View Result Date: 10/01/2024 CLINICAL DATA:  Shortness of breath.  Fever and chills. EXAM: CHEST - 2 VIEW COMPARISON:  04/21/2019.  Chest CT 06/10/2023 FINDINGS: The cardiomediastinal contours are normal. Bronchial thickening. Pulmonary vasculature is normal. No consolidation, pleural effusion, or pneumothorax. No acute osseous abnormalities are seen. Thoracic spondylosis. IMPRESSION: Bronchial thickening. Electronically Signed   By: Andrea Gasman M.D.   On: 10/01/2024 14:20   Labs on Admission: I have personally reviewed following labs  CBC: Recent Labs  Lab 10/01/24 1400 10/02/24 0819  WBC 9.9 10.2  HGB 14.5 14.5  HCT 41.5 42.4  MCV 95.6 96.1  PLT 271 253   Basic Metabolic Panel: Recent Labs  Lab 10/01/24 1400 10/02/24 0819  NA 137 135  K 3.9 3.9  CL 101 102  CO2 22 21*  GLUCOSE 111* 110*  BUN 20 18  CREATININE 1.19 1.31*  CALCIUM 9.1 9.0   GFR: Estimated Creatinine Clearance: 65.4 mL/min (A) (by C-G formula based on SCr of 1.31 mg/dL (H)).  Liver Function Tests: Recent Labs  Lab 10/02/24 0819  AST 22  ALT 19  ALKPHOS 71  BILITOT 0.8  PROT 7.6  ALBUMIN 3.6   This document was prepared using Dragon Voice Recognition software and may include unintentional dictation errors.  Dr. Sherre [facility]  If 7PM-7AM, please contact overnight-coverage provider If 7AM-7PM, please contact day attending provider [email or web address]  10/02/2024, 1:45 PM

## 2024-10-02 NOTE — Assessment & Plan Note (Addendum)
 Suspect multifactorial including possible viral pneumonia versus bacterial pneumonia as patient is immunocompromise in setting of methotrexate use Complicated by likely undiagnosed sleep apnea, and diffuse heterogenous ground glass attenuation in multiple lobes with mild emphysema and diffuse bronchial wall thickening. CPAP nightly ordered Continuous pulse oximetry Ceftriaxone 1 g IV daily, azithromycin 500 mg IV daily to complete a 5-day course

## 2024-10-02 NOTE — ED Notes (Signed)
 RN to bedside. Pt sleeping. Oxygen sats 88%. Pt awakened and oxygen increased to 93%. Pt placed on 2L [place] as pt was wanting to rest more. EDP made aware.

## 2024-10-02 NOTE — Assessment & Plan Note (Signed)
 Amlodipine 5 mg every evening resume

## 2024-10-03 DIAGNOSIS — J9601 Acute respiratory failure with hypoxia: Secondary | ICD-10-CM | POA: Diagnosis not present

## 2024-10-03 LAB — CBC
HCT: 41.7 % (ref 39.0–52.0)
Hemoglobin: 14.3 g/dL (ref 13.0–17.0)
MCH: 33.1 pg (ref 26.0–34.0)
MCHC: 34.3 g/dL (ref 30.0–36.0)
MCV: 96.5 fL (ref 80.0–100.0)
Platelets: 266 K/uL (ref 150–400)
RBC: 4.32 MIL/uL (ref 4.22–5.81)
RDW: 13.5 % (ref 11.5–15.5)
WBC: 8.5 K/uL (ref 4.0–10.5)
nRBC: 0 % (ref 0.0–0.2)

## 2024-10-03 LAB — RESPIRATORY PANEL BY PCR

## 2024-10-03 LAB — BASIC METABOLIC PANEL WITH GFR
Anion gap: 9 (ref 5–15)
BUN: 18 mg/dL (ref 8–23)
CO2: 25 mmol/L (ref 22–32)
Calcium: 9 mg/dL (ref 8.9–10.3)
Chloride: 100 mmol/L (ref 98–111)
Creatinine, Ser: 1.24 mg/dL (ref 0.61–1.24)
GFR, Estimated: >60 mL/min (ref >60)
Glucose, Bld: 105 mg/dL — ABNORMAL HIGH (ref 70–99)
Potassium: 4.7 mmol/L (ref 3.5–5.1)
Sodium: 134 mmol/L — ABNORMAL LOW (ref 135–145)

## 2024-10-03 LAB — PROCALCITONIN: Procalcitonin: <0.1 ng/mL

## 2024-10-03 MED ORDER — GUAIFENESIN 100 MG/5ML PO LIQD
5.0000 mL | ORAL | Status: DC | PRN
  Administered 2024-10-03 – 2024-10-04 (×4): 5 mL via ORAL
  Filled 2024-10-03 (×4): qty 10

## 2024-10-03 NOTE — Progress Notes (Signed)
  PROGRESS NOTE    Edwin Ali  FMW:[id number] DOB: [birth year] DOA: 10/02/2024 PCP: Sherial Bail, MD  120A/120A-AA  LOS: 1 day   Brief hospital course:   Assessment & Plan: Edwin Ali is a 63 year old male with history of hypertension, rheumatoid arthritis on methotrexate, presents for chief concerns of oxygen saturation dropping in the 70s at home.    * Acute hypoxic respiratory failure (HCC) Suspect multifactorial including possible viral pneumonia versus bacterial pneumonia as patient is immunocompromise in setting of methotrexate use Complicated by likely undiagnosed sleep apnea, and diffuse heterogenous ground glass attenuation in multiple lobes with mild emphysema and diffuse bronchial wall thickening. --cont ceftriaxone and azithro for now --RVP  At risk for sleep apnea Patient SpO2 desatted while sleeping in the emergency department and his wife states that he has been snoring louder over the last few months --CPAP ordered but pt couldn't tolerate it. --outpatient sleep study  5 mm right lower lobe pulmonary nodule  --pt has 50-pack smoking years --f/u CT chest in 12 months  Rheumatoid arthritis  --not currently on steroid --methotrexate currently on hold for upcoming surgery  Hyperlipidemia --cont statin  Essential hypertension --cont amlodipine   DVT prophylaxis: Lovenox SQ Code Status: Full code  Family Communication: wife updated at bedside today Level of care: Telemetry Medical Dispo:   The patient is from: home Anticipated d/c is to: home Anticipated d/c date is: 1-2 days   Subjective and Interval History:  Pt reported feeling better.  DOE.   Objective: Vitals:   10/03/24 1221 10/03/24 1230 10/03/24 1259 10/03/24 1634  BP:  110/88  111/73  Pulse:  (!) 119 (!) 110 82  Resp:  16  16  Temp: 99.6 F (37.6 C) 98.8 F (37.1 C)  98.6 F (37 C)  TempSrc: Oral Oral  Oral  SpO2:  96%  93%  Weight:      Height:         Intake/Output Summary (Last 24 hours) at 10/03/2024 1938 Last data filed at 10/03/2024 1751 Gross per 24 hour  Intake 350 ml  Output --  Net 350 ml   Filed Weights   10/02/24 0808 10/02/24 1316  Weight: 97 kg 97 kg    Examination:   Constitutional: NAD, AAOx3 HEENT: conjunctivae and lids normal, EOMI CV: No cyanosis.   RESP: normal respiratory effort, on 2L Neuro: II - XII grossly intact.   Psych: Normal mood and affect.  Appropriate judgement and reason   Data Reviewed: I have personally reviewed labs and imaging studies  Time spent: 50 minutes  Ellouise Haber, MD [facility] If 7PM-7AM, please contact night-coverage 10/03/2024, 7:38 PM

## 2024-10-03 NOTE — Plan of Care (Signed)
  Problem: Clinical Measurements: Goal: Ability to maintain clinical measurements within normal limits will improve Outcome: Progressing Goal: Diagnostic test results will improve Outcome: Progressing Goal: Cardiovascular complication will be avoided Outcome: Progressing   Problem: Activity: Goal: Risk for activity intolerance will decrease Outcome: Progressing   Problem: Coping: Goal: Level of anxiety will decrease Outcome: Progressing   Problem: Pain Managment: Goal: General experience of comfort will improve and/or be controlled Outcome: Progressing

## 2024-10-03 NOTE — Progress Notes (Addendum)
 TOC Brief Assessment:  Patient is from home with his wife and presented with O2 sats as low as 70s at home. Now satting in mid 90s on RA. Patient is being treated with IV abx with suspected resp illness. No TOC needs. Please outreach to [facility] if needs identified.

## 2024-10-04 DIAGNOSIS — J9601 Acute respiratory failure with hypoxia: Secondary | ICD-10-CM | POA: Diagnosis not present

## 2024-10-04 MED ORDER — IPRATROPIUM-ALBUTEROL 0.5-2.5 (3) MG/3ML IN SOLN
3.0000 mL | Freq: Four times a day (QID) | RESPIRATORY_TRACT | Status: DC
  Administered 2024-10-04 – 2024-10-05 (×4): 3 mL via RESPIRATORY_TRACT
  Filled 2024-10-04 (×4): qty 3

## 2024-10-04 NOTE — Progress Notes (Signed)
  PROGRESS NOTE    Edwin Ali  FMW:[id number] DOB: [birth year] DOA: 10/02/2024 PCP: Sherial Bail, MD  120A/120A-AA  LOS: 2 days   Brief hospital course:   Assessment & Plan: Edwin Ali is a 63 year old male with history of hypertension, rheumatoid arthritis on methotrexate, presents for chief concerns of oxygen saturation dropping in the 70s at home.    * Acute hypoxic respiratory failure  (HCC) Suspect multifactorial including possible viral pneumonia versus bacterial pneumonia as patient is immunocompromise in setting of methotrexate use. --CTA chest showed Diffuse bilateral ground-glass attenuation.  Resp panel and RVP neg. --tx as PNA --Continue supplemental O2 to keep sats >=90%, wean as tolerated  Sepsis 2/2 PNA --tachycardia, tachypnea, also developed fever after presentation. --cont ceftriaxone and azithro  At risk for sleep apnea Patient SpO2 desatted while sleeping in the emergency department and his wife states that he has been snoring louder over the last few months --CPAP ordered but pt couldn't tolerate it. --outpatient sleep study  5 mm right lower lobe pulmonary nodule  --pt has 50-pack smoking years --f/u CT chest in 12 months, discussed with pt and family  Rheumatoid arthritis  --not currently on steroid --methotrexate currently on hold for upcoming surgery  Hyperlipidemia --cont statin  Essential hypertension --cont amlodipine   DVT prophylaxis: Lovenox SQ Code Status: Full code  Family Communication: family updated at bedside today Level of care: Telemetry Medical Dispo:   The patient is from: home Anticipated d/c is to: home Anticipated d/c date is: 1-2 days   Subjective and Interval History:  Ambulation test done today, pt desat to 88% after walking 6 ft.   Objective: Vitals:   10/04/24 1500 10/04/24 1612 10/04/24 1952 10/04/24 1953  BP: 114/77   127/81  Pulse: 90   97  Resp: 19   17  Temp: 98.9 F (37.2 C)    98.2 F (36.8 C)  TempSrc: Oral     SpO2: 94% 96% 97% 97%  Weight:      Height:        Intake/Output Summary (Last 24 hours) at 10/04/2024 2027 Last data filed at 10/04/2024 1900 Gross per 24 hour  Intake 920 ml  Output --  Net 920 ml   Filed Weights   10/02/24 0808 10/02/24 1316  Weight: 97 kg 97 kg    Examination:   Constitutional: NAD, AAOx3 HEENT: conjunctivae and lids normal, EOMI CV: No cyanosis.   RESP: normal respiratory effort, on 2L Neuro: II - XII grossly intact.   Psych: Normal mood and affect.  Appropriate judgement and reason   Data Reviewed: I have personally reviewed labs and imaging studies  Time spent: 50 minutes  Ellouise Haber, MD [facility] If 7PM-7AM, please contact night-coverage 10/04/2024, 8:27 PM

## 2024-10-04 NOTE — Progress Notes (Signed)
 Writer to ambulate patient. Oxygen removed while sitting on edge of bed. O2 sat 95%. Ambulated 6 ft in room. Oxygen sat dropped to 88% and he became SOB. Returned to bed and placed O2 on.

## 2024-10-04 NOTE — Plan of Care (Signed)
  Problem: Education: Goal: Knowledge of General Education information will improve Description: Including pain rating scale, medication(s)/side effects and non-pharmacologic comfort measures Outcome: Progressing   Problem: Health Behavior/Discharge Planning: Goal: Ability to manage health-related needs will improve Outcome: Progressing Note: Discussed importance of compliance related to medications and treatment plan   Problem: Nutrition: Goal: Adequate nutrition will be maintained Outcome: Progressing Note: Able to feed self. Appetite good   Problem: Elimination: Goal: Will not experience complications related to urinary retention Outcome: Progressing Note: No signs of urinary retention

## 2024-10-04 NOTE — Plan of Care (Signed)
 Pt c/o cough PRN cough med order; Pt had off and on low grade fever tylenol given x1. Continue to monitor. Problem: Clinical Measurements: Goal: Ability to maintain clinical measurements within normal limits will improve Outcome: Progressing   Problem: Safety: Goal: Ability to remain free from injury will improve Outcome: Progressing   Problem: Pain Managment: Goal: General experience of comfort will improve and/or be controlled Outcome: Progressing   Problem: Elimination: Goal: Will not experience complications related to bowel motility Outcome: Progressing   Problem: Coping: Goal: Level of anxiety will decrease Outcome: Progressing

## 2024-10-05 ENCOUNTER — Ambulatory Visit: Admission: RE | Admit: 2024-10-05 | Payer: PRIVATE HEALTH INSURANCE | Source: Home / Self Care | Admitting: Surgery

## 2024-10-05 ENCOUNTER — Encounter: Admission: RE | Payer: Self-pay | Source: Home / Self Care

## 2024-10-05 DIAGNOSIS — J9601 Acute respiratory failure with hypoxia: Secondary | ICD-10-CM | POA: Diagnosis not present

## 2024-10-05 SURGERY — ARTHROSCOPY, SHOULDER WITH DEBRIDEMENT
Anesthesia: Choice | Site: Shoulder | Laterality: Right

## 2024-10-05 MED ORDER — FUROSEMIDE 10 MG/ML IJ SOLN
40.0000 mg | Freq: Once | INTRAMUSCULAR | Status: AC
  Administered 2024-10-05: 40 mg via INTRAVENOUS
  Filled 2024-10-05: qty 4

## 2024-10-05 MED ORDER — AZITHROMYCIN 500 MG PO TABS
500.0000 mg | ORAL_TABLET | Freq: Every day | ORAL | Status: AC
  Administered 2024-10-05 – 2024-10-06 (×2): 500 mg via ORAL
  Filled 2024-10-05 (×2): qty 1

## 2024-10-05 MED ORDER — KETOROLAC TROMETHAMINE 15 MG/ML IJ SOLN
15.0000 mg | Freq: Four times a day (QID) | INTRAMUSCULAR | Status: DC
  Administered 2024-10-05 – 2024-10-07 (×9): 15 mg via INTRAVENOUS
  Filled 2024-10-05 (×9): qty 1

## 2024-10-05 NOTE — Plan of Care (Signed)
  Problem: Health Behavior/Discharge Planning: Goal: Ability to manage health-related needs will improve Outcome: Progressing Note: Compliant with treatment plan and participates in decision making.   Problem: Clinical Measurements: Goal: Will remain free from infection Outcome: Progressing Note: Continues on an IV antibiotic and oral ABT for pneumonia.  Goal: Respiratory complications will improve Outcome: Progressing Note: Resp even and unlabored at rest. O2 @ 2L [place]. O2 sat decreases with ambulation and he becomes SOB Goal: Cardiovascular complication will be avoided Outcome: Progressing Note: No cardiac complications   Problem: Activity: Goal: Risk for activity intolerance will decrease Outcome: Progressing Note: Decreased oxygen saturation with ambulation   Problem: Nutrition: Goal: Adequate nutrition will be maintained Outcome: Progressing Note: Appetite good   Problem: Pain Managment: Goal: General experience of comfort will improve and/or be controlled Outcome: Progressing Note: Complaints of pain r/t rheumatoid arthritis. New orders given   Problem: Safety: Goal: Ability to remain free from injury will improve Outcome: Progressing Note: Has remained free from injury this shift   Problem: Skin Integrity: Goal: Risk for impaired skin integrity will decrease Outcome: Progressing Note: Skin intact

## 2024-10-05 NOTE — Progress Notes (Signed)
 Mobility Specialist Progress Note:    10/05/24 1122  Mobility  Activity Ambulated with assistance  Level of Assistance Modified independent, requires aide device or extra time  Assistive Device None  Distance Ambulated (ft) 150 ft  Range of Motion/Exercises Active;All extremities  Activity Response Tolerated well  Mobility visit 1 Mobility  Mobility Specialist Start Time (ACUTE ONLY) 1111  Mobility Specialist Stop Time (ACUTE ONLY) 1129  Mobility Specialist Time Calculation (min) (ACUTE ONLY) 18 min   Pt Modi to stand and ambulate with no AD. Prior to ambulation, pt on 3L via [place]. Per MD, pt to ambulate on 2L and monitor saturations and symptoms. See O2 sats below. Pt required multiple rest breaks with diaphragmatic breathing. Audible SOB throughout entire session, pt denies dizziness or lightheadedness. Returned to room, left pt sitting EOB. All needs met.  SpO2 92% on 2L before ambulation SpO2 85% on 2L during ambulation (after 51ft)  SpO2 92% on 3L after rest break, then SpO2 86% on 3L after 34ft ambulation SpO2 90-92% on 4L after rest break and during ambulation    Sherrilee Ditty Mobility Specialist Please contact via SecureChat or  Rehab office at [phone]

## 2024-10-05 NOTE — TOC Progression Note (Signed)
 Transition of Care [facility]) - Progression Note    Patient Details  Name: Edwin Ali MRN: [id number] Date of Birth: 12/25/1960  Transition of Care [facility]) CM/SW Contact  Dalia GORMAN Fuse, RN Phone Number: 10/05/2024, 11:08 AM  Clinical Narrative:    Arturo mid 90s on 2 L [place]. Pharmacy consulted to convert IV abx to oral. No therapy recs at this time. Plan is for the patient to discharge to home when he is medically appropriate.                      Expected Discharge Plan and Services                                               Social Drivers of Health (SDOH) Interventions SDOH Screenings   Food Insecurity: No Food Insecurity (10/02/2024)  Housing: Low Risk  (10/02/2024)  Transportation Needs: No Transportation Needs (10/02/2024)  Utilities: Not At Risk (10/02/2024)  Financial Resource Strain: Low Risk  (09/30/2024)   Received from [facility]  Social Connections: Socially Integrated (10/04/2024)  Tobacco Use: Medium Risk (10/02/2024)    Readmission Risk Interventions     No data to display

## 2024-10-05 NOTE — Progress Notes (Signed)
 PROGRESS NOTE    Edwin Ali  FMW:[id number] DOB: 01-23-1961 DOA: 10/02/2024 PCP: Sherial Bail, MD    Brief Narrative:   63 year old male with history of hypertension, rheumatoid arthritis on methotrexate, presents for chief concerns of oxygen saturation dropping in the 70s at home.   Vitals in the ED showed t 99.4, rr 20, hr initially 112, currently improved to 84, blood pressure 128/97, SpO2 96% on room air and then desatted to 88% in the emergency department.  Patient was placed on 2 L nasal cannula with improvement to 93%.   Serum sodium is 135, potassium 3.9, chloride 102, bicarb 21, BUN of 18, serum creatinine 1.31, eGFR greater than 60, nonfasting blood glucose 110, WBC 10.2, hemoglobin 14.5, platelets of 253.   COVID/influenza A/influenza B/RSV PCR were negative.  Assessment & Plan:   Principal Problem:   Acute hypoxic respiratory failure (HCC) Active Problems:   Essential hypertension   Hyperlipidemia   At risk for sleep apnea Edwin Ali is a 64 year old male with history of hypertension, rheumatoid arthritis on methotrexate, presents for chief concerns of oxygen saturation dropping in the 70s at home.    * Acute hypoxic respiratory failure  (HCC) Suspect multifactorial including possible viral pneumonia versus bacterial pneumonia as patient is immunocompromise in setting of methotrexate use. --CTA chest showed Diffuse bilateral ground-glass attenuation.  Resp panel and RVP neg. Plan: Continue CAP treatment.  Last dose will be 10/30.  Acquire incentive spirometer and flutter.  Educate on use.  Ambulate as tolerated.  Will attempt Lasix challenge 40 mg IV x 1 in effort to wean off supplemental oxygen.   Sepsis 2/2 PNA Sepsis criteria met with tachycardia, tachypnea, fever.  Sepsis physiology is overall resolved.  Continue treatment as above.   At risk for sleep apnea Patient SpO2 desatted while sleeping in the emergency department and his wife states  that he has been snoring louder over the last few months Plan: Will need outpatient polysomnography.  CPAP ordered inpatient but patient could not tolerate  5 mm right lower lobe pulmonary nodule  Patient has 50-pack-year smoking history.  Will need follow-up chest CT.  Discussed with patient and family.  Advise smoking cessation.  Patient states that he will discontinue smoking.   Rheumatoid arthritis  Could not tolerate steroid.  He is on methotrexate as prescribed by outpatient rheumatology.  This is currently on hold.  Continue to hold given acute infection.  Consider restarting within the next week.   Hyperlipidemia PTA statin   Essential hypertension PTA amlodipine     DVT prophylaxis: SQL Code Status: Full Family Communication: Spouse at bedside 10/29 Disposition Plan: Status is: Inpatient Remains inpatient appropriate because: CAP and hypoxia on IV antibiotics   Level of care: Telemetry Medical  Consultants:  None  Procedures:  None  Antimicrobials: Ceftriaxone Azithromycin   Subjective: Seen and examined.  Resting in bed.  No conversational dyspnea noted.  Feels respiratory status is improving.  No pain complaints.  Objective: Vitals:   10/04/24 1953 10/05/24 0510 10/05/24 0728 10/05/24 0831  BP: 127/81 121/83  117/80  Pulse: 97 89  85  Resp: 17 20  18   Temp: 98.2 F (36.8 C) 98.3 F (36.8 C)  99 F (37.2 C)  TempSrc:  Oral    SpO2: 97% 94% 93% 95%  Weight:      Height:        Intake/Output Summary (Last 24 hours) at 10/05/2024 1240 Last data filed at 10/05/2024 0959 Gross per 24 hour  Intake 960 ml  Output --  Net 960 ml   Filed Weights   10/02/24 0808 10/02/24 1316  Weight: 97 kg 97 kg    Examination:  General exam: Appears calm and comfortable  Respiratory system: Diminished breath sounds.  By a basilar scattered crackles.  Normal work of breathing.  2 L Cardiovascular system: S1-S2, RRR, no murmurs, no pedal edema Gastrointestinal  system: Soft, NT/ND, normal bowel sounds Central nervous system: Alert and oriented. No focal neurological deficits. Extremities: Symmetric 5 x 5 power. Skin: No rashes, lesions or ulcers Psychiatry: Judgement and insight appear normal. Mood & affect appropriate.     Data Reviewed: I have personally reviewed following labs and imaging studies  CBC: Recent Labs  Lab 10/01/24 1400 10/02/24 0819 10/03/24 0522  WBC 9.9 10.2 8.5  HGB 14.5 14.5 14.3  HCT 41.5 42.4 41.7  MCV 95.6 96.1 96.5  PLT 271 253 266   Basic Metabolic Panel: Recent Labs  Lab 10/01/24 1400 10/02/24 0819 10/03/24 0750  NA 137 135 134*  K 3.9 3.9 4.7  CL 101 102 100  CO2 22 21* 25  GLUCOSE 111* 110* 105*  BUN 20 18 18   CREATININE 1.19 1.31* 1.24  CALCIUM 9.1 9.0 9.0   GFR: Estimated Creatinine Clearance: 68.2 mL/min (by C-G formula based on SCr of 1.24 mg/dL). Liver Function Tests: Recent Labs  Lab 10/02/24 0819  AST 22  ALT 19  ALKPHOS 71  BILITOT 0.8  PROT 7.6  ALBUMIN 3.6   No results for input(s): LIPASE, AMYLASE in the last 168 hours. No results for input(s): AMMONIA in the last 168 hours. Coagulation Profile: No results for input(s): INR, PROTIME in the last 168 hours. Cardiac Enzymes: No results for input(s): CKTOTAL, CKMB, CKMBINDEX, TROPONINI in the last 168 hours. BNP (last 3 results) No results for input(s): PROBNP in the last 8760 hours. HbA1C: No results for input(s): HGBA1C in the last 72 hours. CBG: No results for input(s): GLUCAP in the last 168 hours. Lipid Profile: No results for input(s): CHOL, HDL, LDLCALC, TRIG, CHOLHDL, LDLDIRECT in the last 72 hours. Thyroid Function Tests: No results for input(s): TSH, T4TOTAL, FREET4, T3FREE, THYROIDAB in the last 72 hours. Anemia Panel: No results for input(s): VITAMINB12, FOLATE, FERRITIN, TIBC, IRON, RETICCTPCT in the last 72 hours. Sepsis Labs: Recent Labs  Lab  10/02/24 1058 10/02/24 1301 10/03/24 0750  PROCALCITON  --   --  <0.10  LATICACIDVEN 1.3 1.4  --     Recent Results (from the past 240 hours)  Resp panel by RT-PCR (RSV, Flu A&B, Covid) Anterior Nasal Swab     Status: None   Collection Time: 10/01/24  2:54 PM   Specimen: Anterior Nasal Swab  Result Value Ref Range Status   SARS Coronavirus 2 by RT PCR NEGATIVE NEGATIVE Final    Comment: (NOTE) SARS-CoV-2 target nucleic acids are NOT DETECTED.  The SARS-CoV-2 RNA is generally detectable in upper respiratory specimens during the acute phase of infection. The lowest concentration of SARS-CoV-2 viral copies this assay can detect is 138 copies/mL. A negative result does not preclude SARS-Cov-2 infection and should not be used as the sole basis for treatment or other patient management decisions. A negative result may occur with  improper specimen collection/handling, submission of specimen other than nasopharyngeal swab, presence of viral mutation(s) within the areas targeted by this assay, and inadequate number of viral copies(<138 copies/mL). A negative result must be combined with clinical observations, patient history, and epidemiological information. The  expected result is Negative.  Fact Sheet for Patients:  bloggercourse.com  Fact Sheet for Healthcare Providers:  seriousbroker.it  This test is no t yet approved or cleared by the [place]  FDA and  has been authorized for detection and/or diagnosis of SARS-CoV-2 by FDA under an Emergency Use Authorization (EUA). This EUA will remain  in effect (meaning this test can be used) for the duration of the COVID-19 declaration under Section 564(b)(1) of the Act, 21 U.S.C.section 360bbb-3(b)(1), unless the authorization is terminated  or revoked sooner.       Influenza A by PCR NEGATIVE NEGATIVE Final   Influenza B by PCR NEGATIVE NEGATIVE Final    Comment: (NOTE) The Xpert  Xpress SARS-CoV-2/FLU/RSV plus assay is intended as an aid in the diagnosis of influenza from Nasopharyngeal swab specimens and should not be used as a sole basis for treatment. Nasal washings and aspirates are unacceptable for Xpert Xpress SARS-CoV-2/FLU/RSV testing.  Fact Sheet for Patients: bloggercourse.com  Fact Sheet for Healthcare Providers: seriousbroker.it  This test is not yet approved or cleared by the [place]  FDA and has been authorized for detection and/or diagnosis of SARS-CoV-2 by FDA under an Emergency Use Authorization (EUA). This EUA will remain in effect (meaning this test can be used) for the duration of the COVID-19 declaration under Section 564(b)(1) of the Act, 21 U.S.C. section 360bbb-3(b)(1), unless the authorization is terminated or revoked.     Resp Syncytial Virus by PCR NEGATIVE NEGATIVE Final    Comment: (NOTE) Fact Sheet for Patients: bloggercourse.com  Fact Sheet for Healthcare Providers: seriousbroker.it  This test is not yet approved or cleared by the [place]  FDA and has been authorized for detection and/or diagnosis of SARS-CoV-2 by FDA under an Emergency Use Authorization (EUA). This EUA will remain in effect (meaning this test can be used) for the duration of the COVID-19 declaration under Section 564(b)(1) of the Act, 21 U.S.C. section 360bbb-3(b)(1), unless the authorization is terminated or revoked.  Performed at [facility], [address] [place], [place] [address]   Blood culture (routine x 2)     Status: None (Preliminary result)   Collection Time: 10/02/24 10:58 AM   Specimen: BLOOD  Result Value Ref Range Status   Specimen Description BLOOD RIGHT ANTECUBITAL  Final   Special Requests   Final    BOTTLES DRAWN AEROBIC AND ANAEROBIC Blood Culture results may not be optimal due to an inadequate volume of blood  received in culture bottles   Culture   Final    NO GROWTH 3 DAYS Performed at [facility], [address], [place], [place] [address]    Report Status PENDING  Incomplete  Blood culture (routine x 2)     Status: None (Preliminary result)   Collection Time: 10/02/24 10:58 AM   Specimen: BLOOD RIGHT ARM  Result Value Ref Range Status   Specimen Description BLOOD RIGHT ARM  Final   Special Requests   Final    BOTTLES DRAWN AEROBIC AND ANAEROBIC Blood Culture results may not be optimal due to an inadequate volume of blood received in culture bottles   Culture   Final    NO GROWTH 3 DAYS Performed at [facility], [address] [place], [place] [address]    Report Status PENDING  Incomplete  Respiratory (~20 pathogens) panel by PCR     Status: None   Collection Time: 10/03/24  1:00 PM   Specimen: Nasopharyngeal Swab; Respiratory  Result Value Ref Range  Status   Adenovirus NOT DETECTED NOT DETECTED Final   Coronavirus 229E NOT DETECTED NOT DETECTED Final    Comment: (NOTE) The Coronavirus on the Respiratory Panel, DOES NOT test for the novel  Coronavirus (2019 nCoV)    Coronavirus HKU1 NOT DETECTED NOT DETECTED Final   Coronavirus NL63 NOT DETECTED NOT DETECTED Final   Coronavirus OC43 NOT DETECTED NOT DETECTED Final   Metapneumovirus NOT DETECTED NOT DETECTED Final   Rhinovirus / Enterovirus NOT DETECTED NOT DETECTED Final   Influenza A NOT DETECTED NOT DETECTED Final   Influenza B NOT DETECTED NOT DETECTED Final   Parainfluenza Virus 1 NOT DETECTED NOT DETECTED Final   Parainfluenza Virus 2 NOT DETECTED NOT DETECTED Final   Parainfluenza Virus 3 NOT DETECTED NOT DETECTED Final   Parainfluenza Virus 4 NOT DETECTED NOT DETECTED Final   Respiratory Syncytial Virus NOT DETECTED NOT DETECTED Final   Bordetella pertussis NOT DETECTED NOT DETECTED Final   Bordetella Parapertussis NOT DETECTED NOT DETECTED Final   Chlamydophila pneumoniae NOT DETECTED NOT  DETECTED Final   Mycoplasma pneumoniae NOT DETECTED NOT DETECTED Final    Comment: Performed at [facility], [address], [place], [place] [address]         Radiology Studies: No results found.      Scheduled Meds:  amLODipine  5 mg Oral QPM   atorvastatin  10 mg Oral QHS   azithromycin  500 mg Oral Daily   enoxaparin (LOVENOX) injection  47.5 mg Subcutaneous Q24H   folic acid  1 mg Oral Daily   ketorolac  15 mg Intravenous Q6H   Continuous Infusions:  cefTRIAXone (ROCEPHIN)  IV 1 g (10/05/24 0913)     LOS: 3 days       Calvin KATHEE Robson, MD [facility]   If 7PM-7AM, please contact night-coverage  10/05/2024, 12:40 PM

## 2024-10-05 NOTE — Progress Notes (Signed)
 PHARMACIST - PHYSICIAN COMMUNICATION  CONCERNING: Antibiotic IV to Oral Route Change Policy  RECOMMENDATION: This patient is receiving Azithromycin  by the intravenous route.  Based on criteria approved by the Pharmacy and Therapeutics Committee, the antibiotic(s) is/are being converted to the equivalent oral dose form(s).   DESCRIPTION: These criteria include: Patient being treated for a respiratory tract infection, urinary tract infection, cellulitis or clostridium difficile associated diarrhea if on metronidazole The patient is not neutropenic and does not exhibit a GI malabsorption state The patient is eating (either orally or via tube) and/or has been taking other orally administered medications for a least 24 hours The patient is improving clinically and has a Tmax < 100.5  If you have questions about this conversion, please contact the Pharmacy Department   Estill CHRISTELLA Lutes, PharmD, BCPS Clinical Pharmacist 10/05/2024 8:04 AM

## 2024-10-05 NOTE — Plan of Care (Signed)
  Problem: Clinical Measurements: Goal: Ability to maintain clinical measurements within normal limits will improve Outcome: Progressing   Problem: Activity: Goal: Risk for activity intolerance will decrease Outcome: Progressing   Problem: Coping: Goal: Level of anxiety will decrease Outcome: Progressing   Problem: Elimination: Goal: Will not experience complications related to bowel motility Outcome: Progressing   Problem: Pain Managment: Goal: General experience of comfort will improve and/or be controlled Outcome: Progressing

## 2024-10-06 ENCOUNTER — Inpatient Hospital Stay: Payer: PRIVATE HEALTH INSURANCE

## 2024-10-06 DIAGNOSIS — J9601 Acute respiratory failure with hypoxia: Secondary | ICD-10-CM | POA: Diagnosis not present

## 2024-10-06 MED ORDER — PREDNISONE 20 MG PO TABS
40.0000 mg | ORAL_TABLET | Freq: Every day | ORAL | Status: DC
Start: 2024-10-06 — End: 2024-10-07
  Administered 2024-10-06 – 2024-10-07 (×2): 40 mg via ORAL
  Filled 2024-10-06 (×2): qty 2

## 2024-10-06 MED ORDER — IPRATROPIUM-ALBUTEROL 0.5-2.5 (3) MG/3ML IN SOLN
3.0000 mL | RESPIRATORY_TRACT | Status: DC | PRN
  Administered 2024-10-06: 3 mL via RESPIRATORY_TRACT
  Filled 2024-10-06: qty 3

## 2024-10-06 MED ORDER — FUROSEMIDE 10 MG/ML IJ SOLN
40.0000 mg | Freq: Once | INTRAMUSCULAR | Status: AC
  Administered 2024-10-06: 40 mg via INTRAVENOUS
  Filled 2024-10-06: qty 4

## 2024-10-06 MED ORDER — CLONAZEPAM 0.5 MG PO TABS: 0.5000 mg | ORAL_TABLET | Freq: Two times a day (BID) | ORAL | Status: DC | PRN

## 2024-10-06 MED ORDER — FLUTICASONE PROPIONATE 50 MCG/ACT NA SUSP
1.0000 | Freq: Every day | NASAL | Status: DC
  Administered 2024-10-06 – 2024-10-07 (×2): 1 via NASAL
  Filled 2024-10-06: qty 16

## 2024-10-06 MED ORDER — IPRATROPIUM-ALBUTEROL 0.5-2.5 (3) MG/3ML IN SOLN
3.0000 mL | Freq: Four times a day (QID) | RESPIRATORY_TRACT | Status: DC
  Administered 2024-10-06: 3 mL via RESPIRATORY_TRACT
  Filled 2024-10-06: qty 3

## 2024-10-06 MED ORDER — GUAIFENESIN ER 600 MG PO TB12
600.0000 mg | ORAL_TABLET | Freq: Two times a day (BID) | ORAL | Status: DC
  Administered 2024-10-06 – 2024-10-07 (×3): 600 mg via ORAL
  Filled 2024-10-06 (×3): qty 1

## 2024-10-06 NOTE — Plan of Care (Signed)
  Problem: Education: Goal: Knowledge of General Education information will improve Description: Including pain rating scale, medication(s)/side effects and non-pharmacologic comfort measures Outcome: Progressing   Problem: Clinical Measurements: Goal: Will remain free from infection Outcome: Progressing Goal: Cardiovascular complication will be avoided Outcome: Progressing   Problem: Safety: Goal: Ability to remain free from injury will improve Outcome: Progressing   Problem: Skin Integrity: Goal: Risk for impaired skin integrity will decrease Outcome: Progressing   

## 2024-10-06 NOTE — Progress Notes (Signed)
 PROGRESS NOTE    Edwin Ali  FMW:[id number] DOB: 1961-07-02 DOA: 10/02/2024 PCP: Sherial Bail, MD    Brief Narrative:   63 year old male with history of hypertension, rheumatoid arthritis on methotrexate, presents for chief concerns of oxygen saturation dropping in the 70s at home.   Vitals in the ED showed t 99.4, rr 20, hr initially 112, currently improved to 84, blood pressure 128/97, SpO2 96% on room air and then desatted to 88% in the emergency department.  Patient was placed on 2 L nasal cannula with improvement to 93%.   Serum sodium is 135, potassium 3.9, chloride 102, bicarb 21, BUN of 18, serum creatinine 1.31, eGFR greater than 60, nonfasting blood glucose 110, WBC 10.2, hemoglobin 14.5, platelets of 253.   COVID/influenza A/influenza B/RSV PCR were negative.  Assessment & Plan:   Principal Problem:   Acute hypoxic respiratory failure (HCC) Active Problems:   Essential hypertension   Hyperlipidemia   At risk for sleep apnea Edwin Ali is a 63 year old male with history of hypertension, rheumatoid arthritis on methotrexate, presents for chief concerns of oxygen saturation dropping in the 70s at home.    * Acute hypoxic respiratory failure  (HCC) Suspect multifactorial including possible viral pneumonia versus bacterial pneumonia as patient is immunocompromise in setting of methotrexate use. --CTA chest showed Diffuse bilateral ground-glass attenuation.  Resp panel and RVP neg. Plan: Completed Treatment today.  Encourage incentive spirometer and flutter valve use.  Ambulate as tolerated.  Repeat Lasix challenge 40 mg IV x 1.  Initiate prednisone 40 mg daily.  Plan for 3 to 5-day course.  Initiate scheduled nebulizers.  Wean off oxygen as tolerated  Sepsis 2/2 PNA Sepsis criteria met with tachycardia, tachypnea, fever.  Sepsis physiology is overall resolved.  Completing antibiotics 10/30.  Continue respiratory treatment as above   At risk for sleep  apnea Patient SpO2 desatted while sleeping in the emergency department and his wife states that he has been snoring louder over the last few months Plan: Will need outpatient polysomnography.   CPAP ordered inpatient but patient could not tolerate  5 mm right lower lobe pulmonary nodule  Patient has 50-pack-year smoking history.  Will need follow-up chest CT.  Discussed with patient and family.  Advise smoking cessation.  Patient states that he will discontinue smoking.   Rheumatoid arthritis  Could not tolerate steroid.  He is on methotrexate as prescribed by outpatient rheumatology.  This is currently on hold.  Continue to hold given acute infection.  Consider restarting within the next week.   Hyperlipidemia PTA statin   Essential hypertension PTA amlodipine     DVT prophylaxis: SQL Code Status: Full Family Communication: Spouse at bedside 10/29, 10/30 Disposition Plan: Status is: Inpatient Remains inpatient appropriate because: CAP and hypoxia on IV antibiotics   Level of care: Telemetry Medical  Consultants:  None  Procedures:  None  Antimicrobials: Ceftriaxone Azithromycin   Subjective: Seen and examined.  Pleasant.  Resting in bed.  Still with some conversational dyspnea.  Objective: Vitals:   10/05/24 1541 10/05/24 1946 10/06/24 0459 10/06/24 0734  BP: 112/81 117/84 133/79 114/81  Pulse: 85 71 76 66  Resp: 18 20 20 16   Temp: 98.1 F (36.7 C) 98.4 F (36.9 C) 97.9 F (36.6 C) 97.8 F (36.6 C)  TempSrc:  Oral Oral   SpO2: 97% 96% 97% 94%  Weight:      Height:        Intake/Output Summary (Last 24 hours) at 10/06/2024 1513 Last  data filed at 10/06/2024 1300 Gross per 24 hour  Intake 420 ml  Output --  Net 420 ml   Filed Weights   10/02/24 0808 10/02/24 1316  Weight: 97 kg 97 kg    Examination:  General exam: Appears anxious Respiratory system: Scattered crackles.  Good air movement.  Normal work of breathing.  2 L Cardiovascular system:  S1-S2, RRR, no murmurs, no pedal edema Gastrointestinal system: Soft, NT/ND, normal bowel sounds Central nervous system: Alert and oriented. No focal neurological deficits. Extremities: Symmetric 5 x 5 power. Skin: No rashes, lesions or ulcers Psychiatry: Judgement and insight appear normal. Mood & affect appropriate.     Data Reviewed: I have personally reviewed following labs and imaging studies  CBC: Recent Labs  Lab 10/01/24 1400 10/02/24 0819 10/03/24 0522  WBC 9.9 10.2 8.5  HGB 14.5 14.5 14.3  HCT 41.5 42.4 41.7  MCV 95.6 96.1 96.5  PLT 271 253 266   Basic Metabolic Panel: Recent Labs  Lab 10/01/24 1400 10/02/24 0819 10/03/24 0750  NA 137 135 134*  K 3.9 3.9 4.7  CL 101 102 100  CO2 22 21* 25  GLUCOSE 111* 110* 105*  BUN 20 18 18   CREATININE 1.19 1.31* 1.24  CALCIUM 9.1 9.0 9.0   GFR: Estimated Creatinine Clearance: 68.2 mL/min (by C-G formula based on SCr of 1.24 mg/dL). Liver Function Tests: Recent Labs  Lab 10/02/24 0819  AST 22  ALT 19  ALKPHOS 71  BILITOT 0.8  PROT 7.6  ALBUMIN 3.6   No results for input(s): LIPASE, AMYLASE in the last 168 hours. No results for input(s): AMMONIA in the last 168 hours. Coagulation Profile: No results for input(s): INR, PROTIME in the last 168 hours. Cardiac Enzymes: No results for input(s): CKTOTAL, CKMB, CKMBINDEX, TROPONINI in the last 168 hours. BNP (last 3 results) No results for input(s): PROBNP in the last 8760 hours. HbA1C: No results for input(s): HGBA1C in the last 72 hours. CBG: No results for input(s): GLUCAP in the last 168 hours. Lipid Profile: No results for input(s): CHOL, HDL, LDLCALC, TRIG, CHOLHDL, LDLDIRECT in the last 72 hours. Thyroid Function Tests: No results for input(s): TSH, T4TOTAL, FREET4, T3FREE, THYROIDAB in the last 72 hours. Anemia Panel: No results for input(s): VITAMINB12, FOLATE, FERRITIN, TIBC, IRON, RETICCTPCT in  the last 72 hours. Sepsis Labs: Recent Labs  Lab 10/02/24 1058 10/02/24 1301 10/03/24 0750  PROCALCITON  --   --  <0.10  LATICACIDVEN 1.3 1.4  --     Recent Results (from the past 240 hours)  Resp panel by RT-PCR (RSV, Flu A&B, Covid) Anterior Nasal Swab     Status: None   Collection Time: 10/01/24  2:54 PM   Specimen: Anterior Nasal Swab  Result Value Ref Range Status   SARS Coronavirus 2 by RT PCR NEGATIVE NEGATIVE Final    Comment: (NOTE) SARS-CoV-2 target nucleic acids are NOT DETECTED.  The SARS-CoV-2 RNA is generally detectable in upper respiratory specimens during the acute phase of infection. The lowest concentration of SARS-CoV-2 viral copies this assay can detect is 138 copies/mL. A negative result does not preclude SARS-Cov-2 infection and should not be used as the sole basis for treatment or other patient management decisions. A negative result may occur with  improper specimen collection/handling, submission of specimen other than nasopharyngeal swab, presence of viral mutation(s) within the areas targeted by this assay, and inadequate number of viral copies(<138 copies/mL). A negative result must be combined with clinical observations, patient history,  and epidemiological information. The expected result is Negative.  Fact Sheet for Patients:  bloggercourse.com  Fact Sheet for Healthcare Providers:  seriousbroker.it  This test is no t yet approved or cleared by the [place]  FDA and  has been authorized for detection and/or diagnosis of SARS-CoV-2 by FDA under an Emergency Use Authorization (EUA). This EUA will remain  in effect (meaning this test can be used) for the duration of the COVID-19 declaration under Section 564(b)(1) of the Act, 21 U.S.C.section 360bbb-3(b)(1), unless the authorization is terminated  or revoked sooner.       Influenza A by PCR NEGATIVE NEGATIVE Final   Influenza B by PCR  NEGATIVE NEGATIVE Final    Comment: (NOTE) The Xpert Xpress SARS-CoV-2/FLU/RSV plus assay is intended as an aid in the diagnosis of influenza from Nasopharyngeal swab specimens and should not be used as a sole basis for treatment. Nasal washings and aspirates are unacceptable for Xpert Xpress SARS-CoV-2/FLU/RSV testing.  Fact Sheet for Patients: bloggercourse.com  Fact Sheet for Healthcare Providers: seriousbroker.it  This test is not yet approved or cleared by the [place]  FDA and has been authorized for detection and/or diagnosis of SARS-CoV-2 by FDA under an Emergency Use Authorization (EUA). This EUA will remain in effect (meaning this test can be used) for the duration of the COVID-19 declaration under Section 564(b)(1) of the Act, 21 U.S.C. section 360bbb-3(b)(1), unless the authorization is terminated or revoked.     Resp Syncytial Virus by PCR NEGATIVE NEGATIVE Final    Comment: (NOTE) Fact Sheet for Patients: bloggercourse.com  Fact Sheet for Healthcare Providers: seriousbroker.it  This test is not yet approved or cleared by the [place]  FDA and has been authorized for detection and/or diagnosis of SARS-CoV-2 by FDA under an Emergency Use Authorization (EUA). This EUA will remain in effect (meaning this test can be used) for the duration of the COVID-19 declaration under Section 564(b)(1) of the Act, 21 U.S.C. section 360bbb-3(b)(1), unless the authorization is terminated or revoked.  Performed at [facility], [address] [place], [place] [address]   Blood culture (routine x 2)     Status: None (Preliminary result)   Collection Time: 10/02/24 10:58 AM   Specimen: BLOOD  Result Value Ref Range Status   Specimen Description BLOOD RIGHT ANTECUBITAL  Final   Special Requests   Final    BOTTLES DRAWN AEROBIC AND ANAEROBIC Blood Culture results  may not be optimal due to an inadequate volume of blood received in culture bottles   Culture   Final    NO GROWTH 4 DAYS Performed at [facility], [address] [place], [place] [address]    Report Status PENDING  Incomplete  Blood culture (routine x 2)     Status: None (Preliminary result)   Collection Time: 10/02/24 10:58 AM   Specimen: BLOOD RIGHT ARM  Result Value Ref Range Status   Specimen Description BLOOD RIGHT ARM  Final   Special Requests   Final    BOTTLES DRAWN AEROBIC AND ANAEROBIC Blood Culture results may not be optimal due to an inadequate volume of blood received in culture bottles   Culture   Final    NO GROWTH 4 DAYS Performed at [facility], [address] [place], [place] [address]    Report Status PENDING  Incomplete  Respiratory (~20 pathogens) panel by PCR     Status: None   Collection Time: 10/03/24  1:00 PM   Specimen: Nasopharyngeal Swab; Respiratory  Result Value Ref Range Status   Adenovirus NOT DETECTED NOT DETECTED Final   Coronavirus 229E NOT DETECTED NOT DETECTED Final    Comment: (NOTE) The Coronavirus on the Respiratory Panel, DOES NOT test for the novel  Coronavirus (2019 nCoV)    Coronavirus HKU1 NOT DETECTED NOT DETECTED Final   Coronavirus NL63 NOT DETECTED NOT DETECTED Final   Coronavirus OC43 NOT DETECTED NOT DETECTED Final   Metapneumovirus NOT DETECTED NOT DETECTED Final   Rhinovirus / Enterovirus NOT DETECTED NOT DETECTED Final   Influenza A NOT DETECTED NOT DETECTED Final   Influenza B NOT DETECTED NOT DETECTED Final   Parainfluenza Virus 1 NOT DETECTED NOT DETECTED Final   Parainfluenza Virus 2 NOT DETECTED NOT DETECTED Final   Parainfluenza Virus 3 NOT DETECTED NOT DETECTED Final   Parainfluenza Virus 4 NOT DETECTED NOT DETECTED Final   Respiratory Syncytial Virus NOT DETECTED NOT DETECTED Final   Bordetella pertussis NOT DETECTED NOT DETECTED Final   Bordetella Parapertussis NOT DETECTED NOT DETECTED  Final   Chlamydophila pneumoniae NOT DETECTED NOT DETECTED Final   Mycoplasma pneumoniae NOT DETECTED NOT DETECTED Final    Comment: Performed at [facility], [address] [place], [place] [address]         Radiology Studies: No results found.      Scheduled Meds:  amLODipine  5 mg Oral QPM   atorvastatin  10 mg Oral QHS   enoxaparin (LOVENOX) injection  47.5 mg Subcutaneous Q24H   fluticasone  1 spray Each Nare Daily   folic acid  1 mg Oral Daily   guaiFENesin  600 mg Oral BID   ipratropium-albuterol  3 mL Nebulization Q6H   ketorolac  15 mg Intravenous Q6H   predniSONE  40 mg Oral Q breakfast   Continuous Infusions:     LOS: 4 days       Calvin KATHEE Robson, MD [facility]   If 7PM-7AM, please contact night-coverage  10/06/2024, 3:13 PM

## 2024-10-06 NOTE — TOC Progression Note (Addendum)
 Transition of Care [facility]) - Progression Note    Patient Details  Name: Steed Kanaan MRN: [id number] Date of Birth: [birth year]  Transition of Care [facility]) CM/SW Contact  Dalia GORMAN Fuse, RN Phone Number: 10/06/2024, 12:35 PM  Clinical Narrative:     Lasix challenge yesterday in an attempt to wean off O2. Required 3.5 L [place] overnight, but currently on RA.   Patient may have new O2/resp needs if unable to wean.     Orders for home O2 received. Referral for home O2 setup called to [employer]. Mitch accepted the referral.               Expected Discharge Plan and Services                                               Social Drivers of Health (SDOH) Interventions SDOH Screenings   Food Insecurity: No Food Insecurity (10/02/2024)  Housing: Low Risk  (10/02/2024)  Transportation Needs: No Transportation Needs (10/02/2024)  Utilities: Not At Risk (10/02/2024)  Financial Resource Strain: Low Risk  (09/30/2024)   Received from [facility]  Social Connections: Socially Integrated (10/04/2024)  Tobacco Use: Medium Risk (10/02/2024)    Readmission Risk Interventions     No data to display

## 2024-10-07 DIAGNOSIS — J9601 Acute respiratory failure with hypoxia: Secondary | ICD-10-CM | POA: Diagnosis not present

## 2024-10-07 LAB — CULTURE, BLOOD (ROUTINE X 2)
Culture: NO GROWTH
Culture: NO GROWTH

## 2024-10-07 MED ORDER — PREDNISONE 20 MG PO TABS: 40.0000 mg | ORAL_TABLET | Freq: Every day | ORAL | Status: AC

## 2024-10-07 NOTE — Evaluation (Signed)
 Physical Therapy Evaluation Patient Details Name: Edwin Ali MRN: [id number] DOB: [birth year] Today's Date: 10/07/2024  History of Present Illness  Pt is a 62 y.o. male admitted with acute hypoxic respiratory failure & sepsis 2/2 pneumonia. PMH significant for HTN, RA, HLD.  Clinical Impression  Pt A&Ox4, pleasant and agreeable to participate in PT evaluation. At baseline, pt is IND with mobility/ADLs, denies hx of AD use or falls. Pt was received walking out of bathroom, performed bed mobility and multiple STS transfers independently. Session focused on O2 weaning trial with ambulation, pt noted to desat to 85% with short bout of amb on RA. When amb on 2L, pt maintained SpO2 >94%- RN and MD informed via secure chat. Pt amb ~153ft total over 2 bouts with seated rest provided between to allow SpO2 to recover, pt reported moderate SOB with longer distance ambulation. Pt receptive to education on importance of monitoring symptoms and SpO2 closely at home and to limit over-exerting himself during recovery. No overt LOB throughout, pt able to perform tight turns and manage [place] well. Pt was left sitting at EOB at end of session, all needs in reach, supportive spouse at bedside. Pt would benefit from skilled PT intervention to address listed deficits (see PT Problem List) and allow for safe return to PLOF.   SaO2 on room air at rest = 91% SaO2 on room air while ambulating = 85% SaO2 on 2 liters of O2 while ambulating = 95%        If plan is discharge home, recommend the following: A little help with bathing/dressing/bathroom;Help with stairs or ramp for entrance   Can travel by private vehicle        Equipment Recommendations None recommended by PT  Recommendations for Other Services       Functional Status Assessment Patient has had a recent decline in their functional status and demonstrates the ability to make significant improvements in function in a reasonable and predictable amount  of time.     Precautions / Restrictions Precautions Precautions: None Restrictions Weight Bearing Restrictions Per Provider Order: No      Mobility  Bed Mobility Overal bed mobility: Independent             General bed mobility comments: able to perform long sitting <> sitting with no physical assistance    Transfers Overall transfer level: Independent Equipment used: None               General transfer comment: Pt performed 3 STS from EOB independently, good eccentric control to sitting    Ambulation/Gait Ambulation/Gait assistance: Supervision Gait Distance (Feet): 120 Feet (over 2 bouts) Assistive device: None Gait Pattern/deviations: Step-through pattern, Wide base of support       General Gait Details: Pt performed 2 bouts of graded ambulation with seated rest break between to allow for SpO2 recovery. No LOB throughout, pt able to perform tight turns well with steady cadence  Stairs            Wheelchair Mobility     Tilt Bed    Modified Rankin (Stroke Patients Only)       Balance Overall balance assessment: Mild deficits observed, not formally tested                                           Pertinent Vitals/Pain Pain Assessment Pain Assessment: No/denies pain  Home Living Family/patient expects to be discharged to:: Private residence Living Arrangements: Spouse/significant other Available Help at Discharge: Family;Available 24 hours/day Type of Home: House Home Access: Stairs to enter Entrance Stairs-Rails: Doctor, General Practice of Steps: 3   Home Layout: One level Home Equipment: Agricultural [employer]ant (2 wheels);BSC/3in1;Shower seat      Prior Function Prior Level of Function : Independent/Modified Independent;Driving             Mobility Comments: IND with mobility, denies previous AD use or hx of falls ADLs Comments: IND with ADLs, was still working until the 25th and is now retired      Extremity/Trunk Assessment   Upper Extremity Assessment Upper Extremity Assessment: Overall WFL for tasks assessed (BUE rotator cuff issues, RA at baseline)    Lower Extremity Assessment Lower Extremity Assessment: Overall WFL for tasks assessed    Cervical / Trunk Assessment Cervical / Trunk Assessment: Normal  Communication   Communication Communication: No apparent difficulties    Cognition Arousal: Alert Behavior During Therapy: WFL for tasks assessed/performed   PT - Cognitive impairments: No apparent impairments                       PT - Cognition Comments: A&Ox4, pleasant and cooperative throughout Following commands: Intact       Cueing Cueing Techniques: Verbal cues, Visual cues     General Comments General comments (skin integrity, edema, etc.): SpO2 on 2L, SpO2 95%> at rest, while walking on 2L O2 90%> or greater    Exercises Other Exercises Other Exercises: HR and SpO2 monitored throughout session: pt initially on 2.5L at rest, SpO2 95% at rest HR mid 70s. Pt trialed on RA SpO2 91%. After short bout of amb SpO2 85-88%, pt placed on 1L but unable to recover SpO2 >90, placed on 2L SpO2 improved to 93%. After longer distance amb on 2L SpO2 95% HR 103bpm. Pt left on 2L SpO2 94% HR 85bpm- RN informed   Assessment/Plan    PT Assessment Patient needs continued PT services  PT Problem List Decreased activity tolerance;Decreased mobility;Cardiopulmonary status limiting activity       PT Treatment Interventions Gait training;Stair training;Functional mobility training;Therapeutic activities;Therapeutic exercise;Balance training;Neuromuscular re-education;Patient/family education    PT Goals (Current goals can be found in the Care Plan section)  Acute Rehab PT Goals Patient Stated Goal: to go home PT Goal Formulation: With patient Time For Goal Achievement: 10/21/24 Potential to Achieve Goals: Good    Frequency Min 2X/week     Co-evaluation                AM-PAC PT 6 Clicks Mobility  Outcome Measure Help needed turning from your back to your side while in a flat bed without using bedrails?: None Help needed moving from lying on your back to sitting on the side of a flat bed without using bedrails?: None Help needed moving to and from a bed to a chair (including a wheelchair)?: None Help needed standing up from a chair using your arms (e.g., wheelchair or bedside chair)?: None Help needed to walk in hospital room?: None Help needed climbing 3-5 steps with a railing? : A Little 6 Click Score: 23    End of Session Equipment Utilized During Treatment: Gait belt;Oxygen Activity Tolerance: Patient tolerated treatment well Patient left: in bed;with call bell/phone within reach;with family/visitor present (pt sitting EOB) Nurse Communication: Mobility status;Other (comment) (SpO2 readings) PT Visit Diagnosis: Other abnormalities of gait and mobility (R26.89)  Time: 9091-9066 PT Time Calculation (min) (ACUTE ONLY): 25 min   Charges:   PT Evaluation $PT Eval Moderate Complexity: 1 Mod PT Treatments $Therapeutic Activity: 8-22 mins PT General Charges $$ ACUTE PT VISIT: 1 Visit         Garey Alleva, SPT

## 2024-10-07 NOTE — Evaluation (Signed)
 Occupational Therapy Evaluation Patient Details Name: Edwin Ali MRN: [id number] DOB: Jun 13, 1961 Today's Date: 10/07/2024   History of Present Illness   Pt is a 63 y.o. male admitted with acute hypoxic respiratory failure & sepsis 2/2 pneumonia. PMH significant for HTN, RA, HLD.     Clinical Impressions Pt admitted with above. Prior to admission, pt lives with spouse and performs ADLs + mobility independently, drives and recently retired. Pt has been performing ADLs independently in room, and demonstrates baseline ability for self-care tasks. OT provided education on activity pacing, self-monitoring O2 levels using pulse-ox, breathing techniques and energy conservation strategies with pt verbalizing understanding. Pt performs bed mobility, transfers and walks 200 ft in hallway independently while managing O2 tank, no LOB noted. Mild dyspnea upon exertion with SpO2 remaining 90% or above on 2L/min via [place]. Do not anticipate OT needs following hospitalization. Encouraged pt to mobilize as tolerated throughout the day. No acute OT needs identified, OT will sign off.      If plan is discharge home, recommend the following:   Help with stairs or ramp for entrance     Functional Status Assessment   Patient has had a recent decline in their functional status and demonstrates the ability to make significant improvements in function in a reasonable and predictable amount of time.     Equipment Recommendations   None recommended by OT      Precautions/Restrictions   Precautions Precautions: None Restrictions Weight Bearing Restrictions Per Provider Order: No     Mobility Bed Mobility Overal bed mobility: Independent                  Transfers Overall transfer level: Independent Equipment used: None                      Balance Overall balance assessment: No apparent balance deficits (not formally assessed)                                          ADL either performed or assessed with clinical judgement   ADL Overall ADL's : Independent;At baseline                                       General ADL Comments: pt at baseline, has been performing ADLs ind in room with O2 line mgmt. completed 200 ft mobility independently while managing O2 tank, no LOB     Vision Baseline Vision/History: 1 Wears glasses Ability to See in Adequate Light: 0 Adequate Vision Assessment?: Wears glasses for reading;Wears glasses for driving            Pertinent Vitals/Pain Pain Assessment Pain Assessment: No/denies pain     Extremity/Trunk Assessment Upper Extremity Assessment Upper Extremity Assessment: Overall WFL for tasks assessed (BUE rotator cuff issues, RA at baseline)   Lower Extremity Assessment Lower Extremity Assessment: Overall WFL for tasks assessed   Cervical / Trunk Assessment Cervical / Trunk Assessment: Normal   Communication Communication Communication: No apparent difficulties   Cognition Arousal: Alert Behavior During Therapy: WFL for tasks assessed/performed Cognition: No apparent impairments                               Following commands: Intact  Cueing  General Comments   Cueing Techniques: Verbal cues;Visual cues  SpO2 on 2L, SpO2 95%> at rest, while walking on 2L O2 90%> or greater   Exercises Exercises: Other exercises Other Exercises Other Exercises: edu on energy conservation strategies, activity pacing, self-monitoring O2 levels at home, personal pulse ox use, seated showers with O2 line mgmt, PLB techniques   Shoulder Instructions      Home Living Family/patient expects to be discharged to:: Private residence Living Arrangements: Spouse/significant other Available Help at Discharge: Family;Available 24 hours/day Type of Home: House Home Access: Stairs to enter [employer] of Steps: 3 Entrance Stairs-Rails: Right;Left Home Layout:  One level     Bathroom Shower/Tub: Chief Strategy Officer: Handicapped height Bathroom Accessibility: Yes   Home Equipment: Agricultural Consultant (2 wheels);BSC/3in1;Shower seat          Prior Functioning/Environment Prior Level of Function : Independent/Modified Independent;Driving             Mobility Comments: IND with mobility, denies previous AD use or hx of falls ADLs Comments: IND with ADLs, was still working until the 25th and is now retired    PHARMACIST, COMMUNITY Problem List: Cardiopulmonary status limiting activity   OT Treatment/Interventions: Restaurant manager, fast food;Energy conservation;Self-care/ADL training;DME and/or AE instruction      OT Goals(Current goals can be found in the care plan section)   Acute Rehab OT Goals OT Goal Formulation: All assessment and education complete, DC therapy Potential to Achieve Goals: Good   OT Frequency:  Min 1X/week    Co-evaluation              AM-PAC OT 6 Clicks Daily Activity     Outcome Measure Help from another person eating meals?: None Help from another person taking care of personal grooming?: None Help from another person toileting, which includes using toliet, bedpan, or urinal?: None Help from another person bathing (including washing, rinsing, drying)?: None Help from another person to put on and taking off regular upper body clothing?: None Help from another person to put on and taking off regular lower body clothing?: None 6 Click Score: 24   End of Session Equipment Utilized During Treatment: Oxygen Nurse Communication: Mobility status  Activity Tolerance: Patient tolerated treatment well Patient left: in bed;with call bell/phone within reach;with family/visitor present  OT Visit Diagnosis: Other abnormalities of gait and mobility (R26.89)                Time: 9054-8989 OT Time Calculation (min): 25 min Charges:  OT General Charges $OT Visit: 1 Visit OT Evaluation $OT Eval Low Complexity: 1  Low OT Treatments $Self Care/Home Management : 8-22 mins  Ski Polich L. Bellarae Lizer, OTR/L  10/07/24, 11:01 AM

## 2024-10-07 NOTE — Progress Notes (Signed)
 SATURATION QUALIFICATIONS: (This note is used to comply with regulatory documentation for home oxygen)  Patient Saturations on Room Air at Rest = 86%  Patient Saturations on Room Air while Ambulating = 85%  Patient Saturations on 2 Liters of oxygen while Ambulating = 91%

## 2024-10-07 NOTE — Discharge Summary (Signed)
 Physician Discharge Summary  Edwin Ali FMW:[id number] DOB: [birth year] DOA: 10/02/2024  PCP: Sherial Bail, MD  Admit date: 10/02/2024 Discharge date: 10/07/2024  Admitted From: Home Disposition:  Home  Recommendations for Outpatient Follow-up:  Follow up with PCP in 1-2 weeks Referral to outpatient cardiopulmonary rehab  Home Health: No Equipment/Devices: Oxygen 2 L by nasal cannula  Discharge Condition: Stable CODE STATUS: Full Diet recommendation: Regular  Brief/Interim Summary:   63 year old male with history of hypertension, rheumatoid arthritis on methotrexate, presents for chief concerns of oxygen saturation dropping in the 70s at home.   Vitals in the ED showed t 99.4, rr 20, hr initially 112, currently improved to 84, blood pressure 128/97, SpO2 96% on room air and then desatted to 88% in the emergency department.  Patient was placed on 2 L nasal cannula with improvement to 93%.   Serum sodium is 135, potassium 3.9, chloride 102, bicarb 21, BUN of 18, serum creatinine 1.31, eGFR greater than 60, nonfasting blood glucose 110, WBC 10.2, hemoglobin 14.5, platelets of 253.   COVID/influenza A/influenza B/RSV PCR were negative.    Discharge Diagnoses:  Principal Problem:   Acute hypoxic respiratory failure (HCC) Active Problems:   Essential hypertension   Hyperlipidemia   At risk for sleep apnea  Acute hypoxic respiratory failure  (HCC) Suspect multifactorial including possible viral pneumonia versus bacterial pneumonia as patient is immunocompromise in setting of methotrexate use. --CTA chest showed Diffuse bilateral ground-glass attenuation.  Resp panel and RVP neg. Plan: Completed course of treatment for community-acquired pneumonia.  Sepsis physiology resolved.  Patient still requiring 2 L supplemental oxygen at time of discharge.  Since respiratory status improved will require home oxygen and discharge patient home.  Ambulatory referral to outpatient  cardiopulmonary rehabilitation.  Ambulatory referral to lung cancer screening clinic.  Suspect the patient should be able to wean off oxygen.  Follow-up outpatient PCP 1 to 2 weeks.   Sepsis 2/2 PNA Sepsis physiology resolved.  Continue treatment as above   At risk for sleep apnea Patient SpO2 desatted while sleeping in the emergency department and his wife states that he has been snoring louder over the last few months Plan: Will need outpatient polysomnography.   CPAP ordered inpatient but patient could not tolerate We will refer to outpatient pulmonary   5 mm right lower lobe pulmonary nodule  Patient has 50-pack-year smoking history.  Will need follow-up chest CT.  Discussed with patient and family.  Advise smoking cessation.  Patient states that he will discontinue smoking.  Referral for lung cancer screening placed at time of DC   Rheumatoid arthritis  Patient is on methotrexate as prescribed by outpatient rheumatology.  Defer to rheumatology and orthopedic surgery as patient has a upcoming surgery.   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% </= FEV1 <80% predicted   Program Prescription:  O2 Administration by RT, EP, or RN if SpO2<88% Spirometry if indicated [facility] only)     After initial evaluation and assessments completed: Virtual Based Care may be provided alone or in conjunction with Pulmonary Rehab/Respiratory Care services based on patient barriers.: Yes   Ambulatory Referral for Lung Cancer Scre   Complete by: As directed    Diet - low sodium  heart healthy   Complete by: As directed    Increase activity slowly   Complete by: As directed       Allergies as of 10/07/2024    No Known Allergies      Medication List     PAUSE taking these medications    predniSONE 5 MG tablet Wait to take this until your doctor or other care provider tells you to start again. Commonly known as: DELTASONE Take 5 mg by mouth daily. You also have another medication with the same name that you may need to continue taking.       STOP taking these medications    metaxalone 800 MG tablet Commonly known as: SKELAXIN   ondansetron 4 MG disintegrating tablet Commonly known as: ZOFRAN-ODT       TAKE these medications    acetaminophen 325 MG tablet Commonly known as: TYLENOL Take 650 mg by mouth every 6 (six) hours as needed for moderate pain (pain score 4-6).   amLODipine 5 MG tablet Commonly known as: NORVASC Take 5 mg by mouth at bedtime.   atorvastatin 10 MG tablet Commonly known as: LIPITOR Take 10 mg by mouth at bedtime.   doxycycline 100 MG tablet Commonly known as: VIBRA-TABS Take 1 tablet (100 mg total) by mouth 2 (two) times daily for 7 days.   Enbrel SureClick 50 MG/ML injection Generic drug: etanercept Inject 50 mg into the skin once a week.   folic acid 1 MG tablet Commonly known as: FOLVITE Take 1 mg by mouth daily.   gabapentin 300 MG capsule Commonly known as: NEURONTIN Take 300 mg by mouth 2 (two) times daily as needed (pain).   ketoconazole 2 % cream Commonly known as: NIZORAL Apply 1 Application topically 2 (two) times daily as needed for irritation.   methotrexate 2.5 MG tablet Commonly known as: RHEUMATREX Take 20 mg by mouth once a week.   predniSONE 20 MG tablet Commonly known as: DELTASONE Take 2 tablets (40 mg total) by mouth daily with breakfast for 2 days. Start taking on: October 08, 2024 What changed: Another medication with the same name was paused. Ask your nurse or doctor if you should take this medication.   sildenafil  50 MG tablet Commonly known as: VIAGRA  Take 1 tablet (50 mg total) by mouth daily as  needed for erectile dysfunction.               Durable Medical Equipment  (From admission, onward)           Start     Ordered   10/07/24 1028  For home use only DME oxygen  Once       Question Answer Comment  Length of Need 6 Months   Mode or (Route) Nasal cannula   Liters per Minute 2   Frequency Continuous (stationary and portable oxygen unit needed)   Oxygen conserving device Yes   Oxygen delivery system: Gas   Oxygen delivery system: Portable concentrator (POC)      10/07/24 1027            Follow-up Information     Sherial Bail, MD Follow up.   Specialty: Internal Medicine Why: hospital follow up Contact information: [address] [place] [address] [phone]                No Known Allergies  Consultations: None   Procedures/Studies: DG Chest Port 1 View Result Date: 10/06/2024 EXAM: 1 VIEW(S) XRAY OF THE CHEST 10/06/2024 03:16:46 PM  COMPARISON: Comparison 10/02/2024. CLINICAL HISTORY: 200808 Hypoxia 200808. FINDINGS: LUNGS AND PLEURA: No focal pulmonary opacity. No pulmonary edema. No pleural effusion. No pneumothorax. HEART AND MEDIASTINUM: No acute abnormality of the cardiac and mediastinal silhouettes. BONES AND SOFT TISSUES: No acute osseous abnormality. IMPRESSION: 1. No acute cardiopulmonary abnormality. Electronically signed by: Lynwood Seip MD 10/06/2024 04:32 PM EDT RP Workstation: [id number]   CT Angio Chest PE W and/or Wo Contrast Result Date: 10/02/2024 EXAM: CTA of the Chest with contrast for PE 10/02/2024 09:54:16 AM TECHNIQUE: CTA of the chest was performed after the administration of 75 mL of iohexol (OMNIPAQUE) 350 MG/ML injection. Multiplanar reformatted images are provided for review. MIP images are provided for review. Automated exposure control, iterative reconstruction, and/or weight based adjustment of the mA/kV was utilized to reduce the radiation dose to as low as reasonably achievable. COMPARISON:  06/10/2003. CLINICAL HISTORY: Pulmonary embolism (PE) suspected, high prob. Pt presents to the ED via POV from home with wife. Pt was here yesterday with chills, headache and fever. Pt reports that his oxygen saturation dropped this morning into the 70's. Pt's O2 saturation 94% on RA at time of triage. Pt was started on an antibiotic last night for possible infection. Pt reports generalized body pain. FINDINGS: PULMONARY ARTERIES: Pulmonary arteries are adequately opacified for evaluation. No pulmonary embolism. Main pulmonary artery is normal in caliber. MEDIASTINUM: The heart demonstrates coronary artery calcifications. No pericardial effusion. Mild aortic atherosclerosis. There is no acute abnormality of the thoracic aorta. LYMPH NODES: Prominent bilateral hilar nodes are identified. Index right hilar node measures 1 cm (image 153/5). Calcified right hilar lymph nodes. No mediastinal or axillary lymphadenopathy. LUNGS AND PLEURA: There is diffuse heterogeneous ground-glass attenuation within the upper and lower lung zones bilaterally. Mild emphysema with diffuse bronchial wall thickening. Scar versus subsegmental atelectasis noted within the lingula. Right upper lobe granulomas again identified. New nodule within the right lower lobe measures 5 mm (image 80/6). Scattered areas of air trapping identified, indicating small airways disease. No significant pleural effusion. No pneumothorax. UPPER ABDOMEN: No acute findings within the limited imaging through the upper abdomen. SOFT TISSUES AND BONES: No acute or suspicious osseous findings. No acute soft tissue abnormality. IMPRESSION: 1. No pulmonary embolism. 2. Diffuse bilateral ground-glass attenuation. Differential considerations include atypical viral infection versus nonspecific inflammatory pneumonitis. Pulmonary edema may have a similar appearance. 3. New 5 mm right lower lobe pulmonary nodule.In a patient who was at low risk, no further follow-up is  indicated. If the patient is at increased risk a follow-up CT of the chest without contrast material in 12 months may be considered 4. Prominent bilateral hilar lymph nodes. Likely reactive. 5. Aortic atherosclerosis and coronary artery calcifications. Electronically signed by: Waddell Calk MD 10/02/2024 10:23 AM EDT RP Workstation: [id number]   DG Chest 2 View Result Date: 10/02/2024 EXAM: 2 VIEW(S) XRAY OF THE CHEST 10/02/2024 08:35:00 AM COMPARISON: 10/01/2024 CLINICAL HISTORY: sob. Pt here yesterday with chills, headache and fever. Pt reports that his oxygen saturation dropped this morning into the 70's. Pt was started on an antibiotic last night for possible infection. Pt reports generalized body pain, slight nausea, and SOB with ; movement. FINDINGS: LUNGS AND PLEURA: No focal pulmonary opacity. No pulmonary edema. No pleural effusion. No pneumothorax. HEART AND MEDIASTINUM: No acute abnormality of the cardiac and mediastinal silhouettes. BONES AND SOFT TISSUES: Multilevel bridging osteophytes in thoracic spine. IMPRESSION: 1. No acute cardiopulmonary process. Electronically signed by: Waddell Calk MD 10/02/2024 08:52 AM EDT RP Workstation: [id number]  DG Chest 2 View Result Date: 10/01/2024 CLINICAL DATA:  Shortness of breath.  Fever and chills. EXAM: CHEST - 2 VIEW COMPARISON:  04/21/2019.  Chest CT 06/10/2023 FINDINGS: The cardiomediastinal contours are normal. Bronchial thickening. Pulmonary vasculature is normal. No consolidation, pleural effusion, or pneumothorax. No acute osseous abnormalities are seen. Thoracic spondylosis. IMPRESSION: Bronchial thickening. Electronically Signed   By: Andrea Gasman M.D.   On: 10/01/2024 14:20   MR SHOULDER RIGHT WO CONTRAST Result Date: 09/17/2024 MR SHOULDER WITHOUT IV CONTRAST RIGHT COMPARISON: None. CLINICAL HISTORY: Right shoulder pain PULSE SEQUENCES: Ax PD FS, Sag T2 FS, Cor T1 & COR T2 FS FINDINGS: Bones: Moderate AC joint arthrosis with  hypertrophy of the [facility] joint reactive edema. There is mild/moderate glenohumeral arthrosis. No fracture or contusion pattern. Mild subacromial edema is present. Rotator cuff: There is moderate insertional tendinosis of the supraspinatus and infraspinatus tendons. There is a bursal surface near full-thickness supraspinatus tendon tear. There may be a small full-thickness component. This measures approximately 1.3 cm in anterior to posterior dimension with 1.5 cm of bursal side retraction. There is an undersurface partial tear with mild interstitial Station of the infraspinatus tendon. Subscapularis demonstrates mild insertional tendinosis. Teres minor is unremarkable. No significant fatty atrophy of the rotator cuff muscles. Labrum and biceps tendon: There is moderate tendinosis of the intra-articular segment of biceps tendon. No significant tear is appreciated. Mild blunting of the labrum without a probable nondisplaced degenerative tear of the posterior inferior labrum best seen on axial image 15 through 17 and coronal image 10 through 12. IMPRESSION: Near full-thickness bursal surface partial tear of the supraspinatus tendon at the 12:00 position. There is mild bursal surface retraction. There may be a small full-thickness component. See above for more detail. Insertional tendinosis of the infraspinatus tendon with a mild undersurface partial tear with interstitial extension. Mild insertional tendinosis of the subscapularis tendon. Likely small nondisplaced degenerative tear of the posterior inferior labrum. Moderate AC joint arthrosis and mild-to-moderate glenohumeral arthrosis. Electronically signed by: Norleen Satchel MD 09/17/2024 01:42 PM EDT RP Workstation: [id number]      Subjective: Seen and on day of discharge.  Stable no distress.  Appropriate discharge home.  Discharge Exam: Vitals:   10/07/24 0437 10/07/24 0750  BP: 127/78 119/64  Pulse: 79 68  Resp: 18 18  Temp: 97.8 F (36.6 C) 97.7 F  (36.5 C)  SpO2: 97% 94%   Vitals:   10/06/24 1538 10/06/24 2010 10/07/24 0437 10/07/24 0750  BP: 117/74 109/75 127/78 119/64  Pulse: 64 87 79 68  Resp:  18 18 18   Temp: 97.9 F (36.6 C) 98.8 F (37.1 C) 97.8 F (36.6 C) 97.7 F (36.5 C)  TempSrc:  Oral Oral Oral  SpO2: 96% 95% 97% 94%  Weight:      Height:        General: Pt is alert, awake, not in acute distress Cardiovascular: RRR, S1/S2 +, no rubs, no gallops Respiratory: CTA bilaterally, no wheezing, no rhonchi Abdominal: Soft, NT, ND, bowel sounds + Extremities: no edema, no cyanosis    The results of significant diagnostics from this hospitalization (including imaging, microbiology, ancillary and laboratory) are listed below for reference.     Microbiology: Recent Results (from the past 240 hours)  Resp panel by RT-PCR (RSV, Flu A&B, Covid) Anterior Nasal Swab     Status: None   Collection Time: 10/01/24  2:54 PM   Specimen: Anterior Nasal Swab  Result Value Ref Range Status   SARS Coronavirus 2  by RT PCR NEGATIVE NEGATIVE Final    Comment: (NOTE) SARS-CoV-2 target nucleic acids are NOT DETECTED.  The SARS-CoV-2 RNA is generally detectable in upper respiratory specimens during the acute phase of infection. The lowest concentration of SARS-CoV-2 viral copies this assay can detect is 138 copies/mL. A negative result does not preclude SARS-Cov-2 infection and should not be used as the sole basis for treatment or other patient management decisions. A negative result may occur with  improper specimen collection/handling, submission of specimen other than nasopharyngeal swab, presence of viral mutation(s) within the areas targeted by this assay, and inadequate number of viral copies(<138 copies/mL). A negative result must be combined with clinical observations, patient history, and epidemiological information. The expected result is Negative.  Fact Sheet for Patients:   bloggercourse.com  Fact Sheet for Healthcare Providers:  seriousbroker.it  This test is no t yet approved or cleared by the [place]  FDA and  has been authorized for detection and/or diagnosis of SARS-CoV-2 by FDA under an Emergency Use Authorization (EUA). This EUA will remain  in effect (meaning this test can be used) for the duration of the COVID-19 declaration under Section 564(b)(1) of the Act, 21 U.S.C.section 360bbb-3(b)(1), unless the authorization is terminated  or revoked sooner.       Influenza A by PCR NEGATIVE NEGATIVE Final   Influenza B by PCR NEGATIVE NEGATIVE Final    Comment: (NOTE) The Xpert Xpress SARS-CoV-2/FLU/RSV plus assay is intended as an aid in the diagnosis of influenza from Nasopharyngeal swab specimens and should not be used as a sole basis for treatment. Nasal washings and aspirates are unacceptable for Xpert Xpress SARS-CoV-2/FLU/RSV testing.  Fact Sheet for Patients: bloggercourse.com  Fact Sheet for Healthcare Providers: seriousbroker.it  This test is not yet approved or cleared by the [place]  FDA and has been authorized for detection and/or diagnosis of SARS-CoV-2 by FDA under an Emergency Use Authorization (EUA). This EUA will remain in effect (meaning this test can be used) for the duration of the COVID-19 declaration under Section 564(b)(1) of the Act, 21 U.S.C. section 360bbb-3(b)(1), unless the authorization is terminated or revoked.     Resp Syncytial Virus by PCR NEGATIVE NEGATIVE Final    Comment: (NOTE) Fact Sheet for Patients: bloggercourse.com  Fact Sheet for Healthcare Providers: seriousbroker.it  This test is not yet approved or cleared by the [place]  FDA and has been authorized for detection and/or diagnosis of SARS-CoV-2 by FDA under an Emergency Use  Authorization (EUA). This EUA will remain in effect (meaning this test can be used) for the duration of the COVID-19 declaration under Section 564(b)(1) of the Act, 21 U.S.C. section 360bbb-3(b)(1), unless the authorization is terminated or revoked.  Performed at [facility], [address] [place], [place] [address]   Blood culture (routine x 2)     Status: None   Collection Time: 10/02/24 10:58 AM   Specimen: BLOOD  Result Value Ref Range Status   Specimen Description BLOOD RIGHT ANTECUBITAL  Final   Special Requests   Final    BOTTLES DRAWN AEROBIC AND ANAEROBIC Blood Culture results may not be optimal due to an inadequate volume of blood received in culture bottles   Culture   Final    NO GROWTH 5 DAYS Performed at [facility], [address], [place], [place] [address]    Report Status 10/07/2024 FINAL  Final  Blood culture (routine x 2)     Status: None   Collection Time: 10/02/24 10:58  AM   Specimen: BLOOD RIGHT ARM  Result Value Ref Range Status   Specimen Description BLOOD RIGHT ARM  Final   Special Requests   Final    BOTTLES DRAWN AEROBIC AND ANAEROBIC Blood Culture results may not be optimal due to an inadequate volume of blood received in culture bottles   Culture   Final    NO GROWTH 5 DAYS Performed at [facility], [address] [place], [place] [address]    Report Status 10/07/2024 FINAL  Final  Respiratory (~20 pathogens) panel by PCR     Status: None   Collection Time: 10/03/24  1:00 PM   Specimen: Nasopharyngeal Swab; Respiratory  Result Value Ref Range Status   Adenovirus NOT DETECTED NOT DETECTED Final   Coronavirus 229E NOT DETECTED NOT DETECTED Final    Comment: (NOTE) The Coronavirus on the Respiratory Panel, DOES NOT test for the novel  Coronavirus (2019 nCoV)    Coronavirus HKU1 NOT DETECTED NOT DETECTED Final   Coronavirus NL63 NOT DETECTED NOT DETECTED Final   Coronavirus OC43 NOT DETECTED NOT DETECTED Final    Metapneumovirus NOT DETECTED NOT DETECTED Final   Rhinovirus / Enterovirus NOT DETECTED NOT DETECTED Final   Influenza A NOT DETECTED NOT DETECTED Final   Influenza B NOT DETECTED NOT DETECTED Final   Parainfluenza Virus 1 NOT DETECTED NOT DETECTED Final   Parainfluenza Virus 2 NOT DETECTED NOT DETECTED Final   Parainfluenza Virus 3 NOT DETECTED NOT DETECTED Final   Parainfluenza Virus 4 NOT DETECTED NOT DETECTED Final   Respiratory Syncytial Virus NOT DETECTED NOT DETECTED Final   Bordetella pertussis NOT DETECTED NOT DETECTED Final   Bordetella Parapertussis NOT DETECTED NOT DETECTED Final   Chlamydophila pneumoniae NOT DETECTED NOT DETECTED Final   Mycoplasma pneumoniae NOT DETECTED NOT DETECTED Final    Comment: Performed at [facility], [address] [place], [place] [address]     Labs: BNP (last 3 results) Recent Labs    10/02/24 0819  BNP 10.5   Basic Metabolic Panel: Recent Labs  Lab 10/01/24 1400 10/02/24 0819 10/03/24 0750  NA 137 135 134*  K 3.9 3.9 4.7  CL 101 102 100  CO2 22 21* 25  GLUCOSE 111* 110* 105*  BUN 20 18 18   CREATININE 1.19 1.31* 1.24  CALCIUM 9.1 9.0 9.0   Liver Function Tests: Recent Labs  Lab 10/02/24 0819  AST 22  ALT 19  ALKPHOS 71  BILITOT 0.8  PROT 7.6  ALBUMIN 3.6   No results for input(s): LIPASE, AMYLASE in the last 168 hours. No results for input(s): AMMONIA in the last 168 hours. CBC: Recent Labs  Lab 10/01/24 1400 10/02/24 0819 10/03/24 0522  WBC 9.9 10.2 8.5  HGB 14.5 14.5 14.3  HCT 41.5 42.4 41.7  MCV 95.6 96.1 96.5  PLT 271 253 266   Cardiac Enzymes: No results for input(s): CKTOTAL, CKMB, CKMBINDEX, TROPONINI in the last 168 hours. BNP: Invalid input(s): POCBNP CBG: No results for input(s): GLUCAP in the last 168 hours. D-Dimer No results for input(s): DDIMER in the last 72 hours. Hgb A1c No results for input(s): HGBA1C in the last 72 hours. Lipid Profile No results  for input(s): CHOL, HDL, LDLCALC, TRIG, CHOLHDL, LDLDIRECT in the last 72 hours. Thyroid function studies No results for input(s): TSH, T4TOTAL, T3FREE, THYROIDAB in the last 72 hours.  Invalid input(s): FREET3 Anemia work up No results for input(s): VITAMINB12, FOLATE, FERRITIN, TIBC, IRON, RETICCTPCT in the last 72  hours. Urinalysis No results found for: COLORURINE, APPEARANCEUR, LABSPEC, PHURINE, GLUCOSEU, HGBUR, BILIRUBINUR, KETONESUR, PROTEINUR, UROBILINOGEN, NITRITE, LEUKOCYTESUR Sepsis Labs Recent Labs  Lab 10/01/24 1400 10/02/24 0819 10/03/24 0522  WBC 9.9 10.2 8.5   Microbiology Recent Results (from the past 240 hours)  Resp panel by RT-PCR (RSV, Flu A&B, Covid) Anterior Nasal Swab     Status: None   Collection Time: 10/01/24  2:54 PM   Specimen: Anterior Nasal Swab  Result Value Ref Range Status   SARS Coronavirus 2 by RT PCR NEGATIVE NEGATIVE Final    Comment: (NOTE) SARS-CoV-2 target nucleic acids are NOT DETECTED.  The SARS-CoV-2 RNA is generally detectable in upper respiratory specimens during the acute phase of infection. The lowest concentration of SARS-CoV-2 viral copies this assay can detect is 138 copies/mL. A negative result does not preclude SARS-Cov-2 infection and should not be used as the sole basis for treatment or other patient management decisions. A negative result may occur with  improper specimen collection/handling, submission of specimen other than nasopharyngeal swab, presence of viral mutation(s) within the areas targeted by this assay, and inadequate number of viral copies(<138 copies/mL). A negative result must be combined with clinical observations, patient history, and epidemiological information. The expected result is Negative.  Fact Sheet for Patients:  bloggercourse.com  Fact Sheet for Healthcare Providers:   seriousbroker.it  This test is no t yet approved or cleared by the [place]  FDA and  has been authorized for detection and/or diagnosis of SARS-CoV-2 by FDA under an Emergency Use Authorization (EUA). This EUA will remain  in effect (meaning this test can be used) for the duration of the COVID-19 declaration under Section 564(b)(1) of the Act, 21 U.S.C.section 360bbb-3(b)(1), unless the authorization is terminated  or revoked sooner.       Influenza A by PCR NEGATIVE NEGATIVE Final   Influenza B by PCR NEGATIVE NEGATIVE Final    Comment: (NOTE) The Xpert Xpress SARS-CoV-2/FLU/RSV plus assay is intended as an aid in the diagnosis of influenza from Nasopharyngeal swab specimens and should not be used as a sole basis for treatment. Nasal washings and aspirates are unacceptable for Xpert Xpress SARS-CoV-2/FLU/RSV testing.  Fact Sheet for Patients: bloggercourse.com  Fact Sheet for Healthcare Providers: seriousbroker.it  This test is not yet approved or cleared by the [place]  FDA and has been authorized for detection and/or diagnosis of SARS-CoV-2 by FDA under an Emergency Use Authorization (EUA). This EUA will remain in effect (meaning this test can be used) for the duration of the COVID-19 declaration under Section 564(b)(1) of the Act, 21 U.S.C. section 360bbb-3(b)(1), unless the authorization is terminated or revoked.     Resp Syncytial Virus by PCR NEGATIVE NEGATIVE Final    Comment: (NOTE) Fact Sheet for Patients: bloggercourse.com  Fact Sheet for Healthcare Providers: seriousbroker.it  This test is not yet approved or cleared by the [place]  FDA and has been authorized for detection and/or diagnosis of SARS-CoV-2 by FDA under an Emergency Use Authorization (EUA). This EUA will remain in effect (meaning this test can be used) for  the duration of the COVID-19 declaration under Section 564(b)(1) of the Act, 21 U.S.C. section 360bbb-3(b)(1), unless the authorization is terminated or revoked.  Performed at [facility], [address] [place], [place] [address]   Blood culture (routine x 2)     Status: None   Collection Time: 10/02/24 10:58 AM   Specimen: BLOOD  Result Value Ref Range Status   Specimen Description BLOOD RIGHT  ANTECUBITAL  Final   Special Requests   Final    BOTTLES DRAWN AEROBIC AND ANAEROBIC Blood Culture results may not be optimal due to an inadequate volume of blood received in culture bottles   Culture   Final    NO GROWTH 5 DAYS Performed at [facility], [address] [place], [place] [address]    Report Status 10/07/2024 FINAL  Final  Blood culture (routine x 2)     Status: None   Collection Time: 10/02/24 10:58 AM   Specimen: BLOOD RIGHT ARM  Result Value Ref Range Status   Specimen Description BLOOD RIGHT ARM  Final   Special Requests   Final    BOTTLES DRAWN AEROBIC AND ANAEROBIC Blood Culture results may not be optimal due to an inadequate volume of blood received in culture bottles   Culture   Final    NO GROWTH 5 DAYS Performed at [facility], [address] [place], [place] [address]    Report Status 10/07/2024 FINAL  Final  Respiratory (~20 pathogens) panel by PCR     Status: None   Collection Time: 10/03/24  1:00 PM   Specimen: Nasopharyngeal Swab; Respiratory  Result Value Ref Range Status   Adenovirus NOT DETECTED NOT DETECTED Final   Coronavirus 229E NOT DETECTED NOT DETECTED Final    Comment: (NOTE) The Coronavirus on the Respiratory Panel, DOES NOT test for the novel  Coronavirus (2019 nCoV)    Coronavirus HKU1 NOT DETECTED NOT DETECTED Final   Coronavirus NL63 NOT DETECTED NOT DETECTED Final   Coronavirus OC43 NOT DETECTED NOT DETECTED Final   Metapneumovirus NOT DETECTED NOT DETECTED Final   Rhinovirus / Enterovirus NOT  DETECTED NOT DETECTED Final   Influenza A NOT DETECTED NOT DETECTED Final   Influenza B NOT DETECTED NOT DETECTED Final   Parainfluenza Virus 1 NOT DETECTED NOT DETECTED Final   Parainfluenza Virus 2 NOT DETECTED NOT DETECTED Final   Parainfluenza Virus 3 NOT DETECTED NOT DETECTED Final   Parainfluenza Virus 4 NOT DETECTED NOT DETECTED Final   Respiratory Syncytial Virus NOT DETECTED NOT DETECTED Final   Bordetella pertussis NOT DETECTED NOT DETECTED Final   Bordetella Parapertussis NOT DETECTED NOT DETECTED Final   Chlamydophila pneumoniae NOT DETECTED NOT DETECTED Final   Mycoplasma pneumoniae NOT DETECTED NOT DETECTED Final    Comment: Performed at [facility], [address] [place], [place] [address]     Time coordinating discharge: 40 minutes  SIGNED:   Calvin KATHEE Robson, MD  [facility] 10/07/2024, 12:05 PM Pager   If 7PM-7AM, please contact night-coverage

## 2024-11-07 ENCOUNTER — Other Ambulatory Visit: Payer: Self-pay

## 2024-11-07 DIAGNOSIS — F17210 Nicotine dependence, cigarettes, uncomplicated: Secondary | ICD-10-CM

## 2024-11-07 DIAGNOSIS — Z87891 Personal history of nicotine dependence: Secondary | ICD-10-CM

## 2024-11-15 ENCOUNTER — Ambulatory Visit: Admitting: Internal Medicine

## 2024-12-16 ENCOUNTER — Ambulatory Visit
Admission: RE | Admit: 2024-12-16 | Discharge: 2024-12-16 | Disposition: A | Payer: Self-pay | Source: Ambulatory Visit | Attending: Acute Care | Admitting: Acute Care

## 2024-12-16 DIAGNOSIS — Z87891 Personal history of nicotine dependence: Secondary | ICD-10-CM | POA: Insufficient documentation

## 2024-12-16 DIAGNOSIS — F17210 Nicotine dependence, cigarettes, uncomplicated: Secondary | ICD-10-CM | POA: Insufficient documentation

## 2024-12-26 ENCOUNTER — Other Ambulatory Visit: Payer: Self-pay | Admitting: Acute Care

## 2024-12-26 DIAGNOSIS — Z87891 Personal history of nicotine dependence: Secondary | ICD-10-CM

## 2024-12-26 DIAGNOSIS — Z122 Encounter for screening for malignant neoplasm of respiratory organs: Secondary | ICD-10-CM

## 2025-01-04 ENCOUNTER — Other Ambulatory Visit: Payer: Self-pay | Admitting: Pulmonary Disease

## 2025-01-04 ENCOUNTER — Ambulatory Visit: Admitting: Internal Medicine

## 2025-01-04 DIAGNOSIS — F17210 Nicotine dependence, cigarettes, uncomplicated: Secondary | ICD-10-CM

## 2025-01-04 DIAGNOSIS — E66811 Obesity, class 1: Secondary | ICD-10-CM

## 2025-01-11 ENCOUNTER — Ambulatory Visit: Admission: RE | Admit: 2025-01-11 | Discharge: 2025-01-11 | Attending: Pulmonary Disease

## 2025-01-11 DIAGNOSIS — F17210 Nicotine dependence, cigarettes, uncomplicated: Secondary | ICD-10-CM

## 2025-01-11 DIAGNOSIS — E66811 Obesity, class 1: Secondary | ICD-10-CM
